# Patient Record
Sex: Female | Born: 1975 | Race: White | Hispanic: No | Marital: Single | Smoking: Never smoker
Health system: Southern US, Community
[De-identification: ages and names within clinical notes are randomized; demographics above are authoritative.]

## PROBLEM LIST (undated history)

## (undated) DIAGNOSIS — Q675 Congenital deformity of spine: Secondary | ICD-10-CM

## (undated) DIAGNOSIS — F329 Major depressive disorder, single episode, unspecified: Secondary | ICD-10-CM

## (undated) DIAGNOSIS — E049 Nontoxic goiter, unspecified: Secondary | ICD-10-CM

## (undated) DIAGNOSIS — M419 Scoliosis, unspecified: Secondary | ICD-10-CM

## (undated) DIAGNOSIS — F429 Obsessive-compulsive disorder, unspecified: Secondary | ICD-10-CM

## (undated) DIAGNOSIS — F32A Depression, unspecified: Secondary | ICD-10-CM

## (undated) DIAGNOSIS — E039 Hypothyroidism, unspecified: Secondary | ICD-10-CM

## (undated) HISTORY — DX: Obsessive-compulsive disorder, unspecified: F42.9

## (undated) HISTORY — DX: Scoliosis, unspecified: M41.9

## (undated) HISTORY — PX: WISDOM TOOTH EXTRACTION: SHX21

## (undated) HISTORY — DX: Hypothyroidism, unspecified: E03.9

## (undated) HISTORY — DX: Nontoxic goiter, unspecified: E04.9

## (undated) HISTORY — DX: Congenital deformity of spine: Q67.5

## (undated) HISTORY — PX: BACK SURGERY: SHX140

## (undated) HISTORY — DX: Depression, unspecified: F32.A

## (undated) HISTORY — DX: Major depressive disorder, single episode, unspecified: F32.9

## (undated) HISTORY — PX: LEG SURGERY: SHX1003

## (undated) HISTORY — PX: OTHER SURGICAL HISTORY: SHX169

---

## 2003-07-29 ENCOUNTER — Other Ambulatory Visit: Admission: RE | Admit: 2003-07-29 | Discharge: 2003-07-29 | Payer: Self-pay | Admitting: Obstetrics and Gynecology

## 2004-06-20 ENCOUNTER — Ambulatory Visit: Payer: Self-pay | Admitting: Sports Medicine

## 2004-08-24 ENCOUNTER — Other Ambulatory Visit: Admission: RE | Admit: 2004-08-24 | Discharge: 2004-08-24 | Payer: Self-pay | Admitting: Obstetrics and Gynecology

## 2010-08-02 ENCOUNTER — Ambulatory Visit (HOSPITAL_COMMUNITY): Admission: RE | Admit: 2010-08-02 | Discharge: 2010-08-02 | Payer: Self-pay | Admitting: Obstetrics & Gynecology

## 2014-12-08 ENCOUNTER — Encounter: Payer: Self-pay | Admitting: *Deleted

## 2014-12-16 ENCOUNTER — Other Ambulatory Visit (HOSPITAL_COMMUNITY)
Admission: RE | Admit: 2014-12-16 | Discharge: 2014-12-16 | Disposition: A | Discharge status: Home / Self Care | Payer: BLUE CROSS/BLUE SHIELD | Source: Ambulatory Visit | Attending: Adult Health | Admitting: Adult Health

## 2014-12-16 ENCOUNTER — Ambulatory Visit (INDEPENDENT_AMBULATORY_CARE_PROVIDER_SITE_OTHER): Payer: BLUE CROSS/BLUE SHIELD | Admitting: Adult Health

## 2014-12-16 ENCOUNTER — Encounter: Payer: Self-pay | Admitting: Adult Health

## 2014-12-16 VITALS — BP 108/58 | HR 64 | Ht 64.75 in | Wt 138.5 lb

## 2014-12-16 DIAGNOSIS — Z113 Encounter for screening for infections with a predominantly sexual mode of transmission: Secondary | ICD-10-CM

## 2014-12-16 DIAGNOSIS — E049 Nontoxic goiter, unspecified: Secondary | ICD-10-CM

## 2014-12-16 DIAGNOSIS — Z01419 Encounter for gynecological examination (general) (routine) without abnormal findings: Secondary | ICD-10-CM | POA: Diagnosis not present

## 2014-12-16 DIAGNOSIS — Z1151 Encounter for screening for human papillomavirus (HPV): Secondary | ICD-10-CM | POA: Insufficient documentation

## 2014-12-16 HISTORY — DX: Nontoxic goiter, unspecified: E04.9

## 2014-12-16 NOTE — Progress Notes (Signed)
Patient ID: Jessica Klein, female   DOB: [birth year], 39 y.o.   MRN: [id number] History of Present Illness: Jessica Klein is a 39 year old white female,single in for well woman gyn exam and pap.Her PCP is Dr Ouida SillsFagan.   Current Medications, Allergies, Past Medical History, Past Surgical History, Family History and Social History were reviewed in [facility] electronic medical record.     Review of Systems: Patient denies any headaches, hearing loss, fatigue, blurred vision, shortness of breath, chest pain, abdominal pain, problems with bowel movements, urination, or intercourse(not sexually active). No joint pain or mood swings(on zoloft).    Physical Exam:BP 108/58 mmHg  Pulse 64  Ht 5' 4.75" (1.645 m)  Wt 138 lb 8 oz (62.823 kg)  BMI 23.22 kg/m2  LMP 12/10/2014 General:  Well developed, well nourished, no acute distress Skin:  Warm and dry Neck:  Midline trachea,  Thyroid enlarged, R>L, good ROM, no lymphadenopathy Lungs; Clear to auscultation bilaterally Breast:  No dominant palpable mass, retraction, or nipple discharge Cardiovascular: Regular rate and rhythm Abdomen:  Soft, non tender, no hepatosplenomegaly Pelvic:  External genitalia is normal in appearance, no lesions.  The vagina is normal in appearance, with good color, moisture or rugae. Urethra has no lesions or masses. The cervix is smooth, pap with HPV performed.  Uterus is felt to be normal size, shape, and contour.  No adnexal masses or tenderness noted.Bladder is non tender, no masses felt. Extremities/musculoskeletal:  No swelling or varicosities noted, no clubbing or cyanosis Psych:  No mood changes, alert and cooperative,seems happy   Impression: Well woman gyn exam with pap Enlarged thyroid STD screening    Plan: Check CBC,CMP,TSH and HIV,RPR,HSV2 [facility], pap in 3 years if normal with negative HPV

## 2014-12-16 NOTE — Patient Instructions (Signed)
Physical in 1 year Mammogram yearly Thyroid US 4/4 at 8 am at [facility] be there at 7:45 am Will talk next week

## 2014-12-17 ENCOUNTER — Telehealth: Payer: Self-pay | Admitting: Adult Health

## 2014-12-17 LAB — COMPREHENSIVE METABOLIC PANEL WITH GFR
ALT: 13 IU/L (ref 0–32)
AST: 20 IU/L (ref 0–40)
Albumin/Globulin Ratio: 2 (ref 1.1–2.5)
Albumin: 4.4 g/dL (ref 3.5–5.5)
Alkaline Phosphatase: 63 IU/L (ref 39–117)
BUN/Creatinine Ratio: 12 (ref 8–20)
BUN: 10 mg/dL (ref 6–20)
Bilirubin Total: 0.3 mg/dL (ref 0.0–1.2)
CO2: 26 mmol/L (ref 18–29)
Calcium: 9.3 mg/dL (ref 8.7–10.2)
Chloride: 99 mmol/L (ref 97–108)
Creatinine, Ser: 0.82 mg/dL (ref 0.57–1.00)
GFR calc Af Amer: 106 mL/min/1.73
GFR calc non Af Amer: 92 mL/min/1.73
Globulin, Total: 2.2 g/dL (ref 1.5–4.5)
Glucose: 82 mg/dL (ref 65–99)
Potassium: 3.7 mmol/L (ref 3.5–5.2)
Sodium: 140 mmol/L (ref 134–144)
Total Protein: 6.6 g/dL (ref 6.0–8.5)

## 2014-12-17 LAB — CBC
HCT: 40.3 % (ref 34.0–46.6)
HEMOGLOBIN: 13.5 g/dL (ref 11.1–15.9)
MCH: 29.5 pg (ref 26.6–33.0)
MCHC: 33.5 g/dL (ref 31.5–35.7)
MCV: 88 fL (ref 79–97)
Platelets: 406 10*3/uL — ABNORMAL HIGH (ref 150–379)
RBC: 4.57 x10E6/uL (ref 3.77–5.28)
RDW: 12.7 % (ref 12.3–15.4)
WBC: 7.6 10*3/uL (ref 3.4–10.8)

## 2014-12-17 LAB — HSV 2 ANTIBODY, IGG: HSV 2 Glycoprotein G Ab, IgG: <0.91 {index} (ref 0.00–0.90)

## 2014-12-17 LAB — TSH: TSH: 32.45 u[IU]/mL — ABNORMAL HIGH (ref 0.450–4.500)

## 2014-12-17 LAB — RPR: RPR Ser Ql: NONREACTIVE

## 2014-12-17 LAB — HIV ANTIBODY (ROUTINE TESTING W REFLEX): HIV Screen 4th Generation wRfx: NONREACTIVE

## 2014-12-17 MED ORDER — LEVOTHYROXINE SODIUM 50 MCG PO TABS: 50.0000 ug | ORAL_TABLET | Freq: Every day | ORAL | Status: DC

## 2014-12-17 NOTE — Telephone Encounter (Signed)
Left message about labs and that TSH elevated and will Rx synthroid 50 mcg #30 1 daily with 3 refills will recheck labs in 8-12 weeks

## 2014-12-20 ENCOUNTER — Ambulatory Visit (HOSPITAL_COMMUNITY)
Admission: RE | Admit: 2014-12-20 | Discharge: 2014-12-20 | Disposition: A | Discharge status: Home / Self Care | Payer: BLUE CROSS/BLUE SHIELD | Source: Ambulatory Visit | Attending: Adult Health | Admitting: Adult Health

## 2014-12-20 DIAGNOSIS — E049 Nontoxic goiter, unspecified: Secondary | ICD-10-CM | POA: Diagnosis present

## 2014-12-20 LAB — CYTOLOGY - PAP

## 2014-12-23 ENCOUNTER — Telehealth: Payer: Self-pay | Admitting: Adult Health

## 2014-12-23 NOTE — Telephone Encounter (Signed)
Left message about thyroid US and to call me if has any questions

## 2014-12-23 NOTE — Telephone Encounter (Signed)
Pt taking synthroid and aware of US will  Recheck TSH in about 8 weeks and watch thyroid for now.

## 2015-08-19 ENCOUNTER — Other Ambulatory Visit: Payer: Self-pay | Admitting: Adult Health

## 2015-12-21 ENCOUNTER — Other Ambulatory Visit: Payer: Self-pay | Admitting: Adult Health

## 2016-03-22 ENCOUNTER — Other Ambulatory Visit: Payer: Self-pay | Admitting: Adult Health

## 2016-06-26 ENCOUNTER — Other Ambulatory Visit: Payer: Self-pay | Admitting: Adult Health

## 2017-03-28 DIAGNOSIS — F331 Major depressive disorder, recurrent, moderate: Secondary | ICD-10-CM | POA: Diagnosis not present

## 2017-03-28 DIAGNOSIS — E785 Hyperlipidemia, unspecified: Secondary | ICD-10-CM | POA: Diagnosis not present

## 2017-03-28 DIAGNOSIS — M545 Low back pain: Secondary | ICD-10-CM | POA: Diagnosis not present

## 2017-03-28 DIAGNOSIS — R5383 Other fatigue: Secondary | ICD-10-CM | POA: Diagnosis not present

## 2017-03-28 DIAGNOSIS — Z0389 Encounter for observation for other suspected diseases and conditions ruled out: Secondary | ICD-10-CM | POA: Diagnosis not present

## 2017-03-28 DIAGNOSIS — E039 Hypothyroidism, unspecified: Secondary | ICD-10-CM | POA: Diagnosis not present

## 2017-03-28 DIAGNOSIS — Z0000 Encounter for general adult medical examination without abnormal findings: Secondary | ICD-10-CM | POA: Diagnosis not present

## 2017-03-29 ENCOUNTER — Other Ambulatory Visit (HOSPITAL_COMMUNITY): Payer: Self-pay | Admitting: Internal Medicine

## 2017-03-29 ENCOUNTER — Ambulatory Visit (HOSPITAL_COMMUNITY)
Admission: RE | Admit: 2017-03-29 | Discharge: 2017-03-29 | Disposition: A | Discharge status: Home / Self Care | Payer: 59 | Source: Ambulatory Visit | Attending: Internal Medicine | Admitting: Internal Medicine

## 2017-03-29 ENCOUNTER — Encounter (HOSPITAL_COMMUNITY): Payer: Self-pay

## 2017-03-29 DIAGNOSIS — F329 Major depressive disorder, single episode, unspecified: Secondary | ICD-10-CM | POA: Diagnosis not present

## 2017-03-29 DIAGNOSIS — R52 Pain, unspecified: Secondary | ICD-10-CM

## 2017-04-04 ENCOUNTER — Ambulatory Visit (HOSPITAL_COMMUNITY)
Admission: RE | Admit: 2017-04-04 | Discharge: 2017-04-04 | Disposition: A | Discharge status: Home / Self Care | Payer: 59 | Source: Ambulatory Visit | Attending: Internal Medicine | Admitting: Internal Medicine

## 2017-04-04 DIAGNOSIS — Z981 Arthrodesis status: Secondary | ICD-10-CM | POA: Diagnosis not present

## 2017-04-04 DIAGNOSIS — M4186 Other forms of scoliosis, lumbar region: Secondary | ICD-10-CM | POA: Diagnosis not present

## 2017-04-04 DIAGNOSIS — M545 Low back pain: Secondary | ICD-10-CM | POA: Insufficient documentation

## 2017-04-12 DIAGNOSIS — F329 Major depressive disorder, single episode, unspecified: Secondary | ICD-10-CM | POA: Diagnosis not present

## 2017-04-26 DIAGNOSIS — F329 Major depressive disorder, single episode, unspecified: Secondary | ICD-10-CM | POA: Diagnosis not present

## 2017-06-07 DIAGNOSIS — F329 Major depressive disorder, single episode, unspecified: Secondary | ICD-10-CM | POA: Diagnosis not present

## 2017-07-23 DIAGNOSIS — E039 Hypothyroidism, unspecified: Secondary | ICD-10-CM | POA: Diagnosis not present

## 2017-08-23 DIAGNOSIS — F329 Major depressive disorder, single episode, unspecified: Secondary | ICD-10-CM | POA: Diagnosis not present

## 2017-11-29 ENCOUNTER — Other Ambulatory Visit (HOSPITAL_COMMUNITY)
Admission: RE | Admit: 2017-11-29 | Discharge: 2017-11-29 | Disposition: A | Discharge status: Home / Self Care | Payer: No Typology Code available for payment source | Source: Ambulatory Visit | Attending: Adult Health | Admitting: Adult Health

## 2017-11-29 ENCOUNTER — Ambulatory Visit: Payer: No Typology Code available for payment source | Admitting: Adult Health

## 2017-11-29 ENCOUNTER — Other Ambulatory Visit: Payer: Self-pay

## 2017-11-29 ENCOUNTER — Encounter: Payer: Self-pay | Admitting: Adult Health

## 2017-11-29 ENCOUNTER — Encounter (INDEPENDENT_AMBULATORY_CARE_PROVIDER_SITE_OTHER): Payer: Self-pay

## 2017-11-29 VITALS — BP 120/68 | HR 71 | Ht 63.75 in | Wt 118.0 lb

## 2017-11-29 DIAGNOSIS — Z124 Encounter for screening for malignant neoplasm of cervix: Secondary | ICD-10-CM | POA: Insufficient documentation

## 2017-11-29 DIAGNOSIS — Z01419 Encounter for gynecological examination (general) (routine) without abnormal findings: Secondary | ICD-10-CM

## 2017-11-29 DIAGNOSIS — Z1211 Encounter for screening for malignant neoplasm of colon: Secondary | ICD-10-CM

## 2017-11-29 DIAGNOSIS — Z0000 Encounter for general adult medical examination without abnormal findings: Secondary | ICD-10-CM | POA: Insufficient documentation

## 2017-11-29 DIAGNOSIS — Z1212 Encounter for screening for malignant neoplasm of rectum: Secondary | ICD-10-CM

## 2017-11-29 LAB — HEMOCCULT GUIAC POC 1CARD (OFFICE): FECAL OCCULT BLD: NEGATIVE

## 2017-11-29 NOTE — Progress Notes (Signed)
Subjective:     Patient ID: Jessica Klein, female   DOB: 1976/08/29, 42 y.o.   MRN: [id number]  HPI [name] is a 42 year old white female, single, G0P0, in for pelvic and pap, she had her physical with PCP. PCP is Dr Karilyn CotaGosrani, who she works for. And she sees [facility]   Review of Systems Patient denies any headaches, hearing loss, fatigue, blurred vision, shortness of breath, chest pain, abdominal pain, problems with bowel movements, urination, or intercourse(not having sex). No joint pain or mood swings.   Reviewed past medical,surgical, social and family history. Reviewed medications and allergies.  Objective:   Physical Exam BP 120/68 (BP Location: Right Arm, Patient Position: Sitting, Cuff Size: Normal)   Pulse 71   Ht 5' 3.75" (1.619 m)   Wt 118 lb (53.5 kg)   LMP 11/24/2017   BMI 20.41 kg/m   Skin warm and dry,  Breasts:no dominate palpable mass, retraction or nipple discharge. Pelvic: external genitalia is normal in appearance no lesions, vagina: pink, good moisture, brown blood,urethra has no lesions or masses noted, cervix:smooth,pap with HPV and GC/CHL performed, uterus: normal size, shape and contour, non tender, no masses felt, adnexa: no masses or tenderness noted. Bladder is non tender and no masses felt.    On rectal exam has good tone, no masses and hemoccult negative.  Assessment:     1. Encounter for gynecological examination with Papanicolaou smear of cervix   2. Routine cervical smear   3. Screening for colorectal cancer       Plan:     Pelvic in 1 year, pap in 3 if normal Get mammogram at 42 Labs with PCP

## 2017-12-03 LAB — CYTOLOGY - PAP
Chlamydia: NEGATIVE
Diagnosis: NEGATIVE
HPV: NOT DETECTED
Neisseria Gonorrhea: NEGATIVE

## 2017-12-19 ENCOUNTER — Other Ambulatory Visit (HOSPITAL_COMMUNITY): Payer: Self-pay | Admitting: Internal Medicine

## 2017-12-19 DIAGNOSIS — Z1231 Encounter for screening mammogram for malignant neoplasm of breast: Secondary | ICD-10-CM

## 2017-12-25 ENCOUNTER — Other Ambulatory Visit (HOSPITAL_COMMUNITY): Payer: Self-pay | Admitting: Internal Medicine

## 2017-12-25 DIAGNOSIS — M545 Low back pain: Secondary | ICD-10-CM

## 2018-01-03 ENCOUNTER — Ambulatory Visit (HOSPITAL_COMMUNITY)
Admission: RE | Admit: 2018-01-03 | Discharge: 2018-01-03 | Disposition: A | Discharge status: Home / Self Care | Payer: No Typology Code available for payment source | Source: Ambulatory Visit | Attending: Internal Medicine | Admitting: Internal Medicine

## 2018-01-03 ENCOUNTER — Ambulatory Visit (HOSPITAL_COMMUNITY): Payer: No Typology Code available for payment source

## 2018-01-03 DIAGNOSIS — Z1231 Encounter for screening mammogram for malignant neoplasm of breast: Secondary | ICD-10-CM | POA: Insufficient documentation

## 2018-01-17 ENCOUNTER — Ambulatory Visit (HOSPITAL_COMMUNITY)
Admission: RE | Admit: 2018-01-17 | Discharge: 2018-01-17 | Disposition: A | Discharge status: Home / Self Care | Payer: No Typology Code available for payment source | Source: Ambulatory Visit | Attending: Internal Medicine | Admitting: Internal Medicine

## 2018-01-17 DIAGNOSIS — M48061 Spinal stenosis, lumbar region without neurogenic claudication: Secondary | ICD-10-CM | POA: Diagnosis not present

## 2018-01-17 DIAGNOSIS — M545 Low back pain: Secondary | ICD-10-CM | POA: Diagnosis present

## 2018-01-17 DIAGNOSIS — Z981 Arthrodesis status: Secondary | ICD-10-CM | POA: Insufficient documentation

## 2018-01-17 MED ORDER — IOPAMIDOL (ISOVUE-300) INJECTION 61%
100.0000 mL | Freq: Once | INTRAVENOUS | Status: AC | PRN
  Administered 2018-01-17: 100 mL via INTRAVENOUS

## 2018-06-20 ENCOUNTER — Other Ambulatory Visit: Payer: Self-pay

## 2018-06-20 MED ORDER — SERTRALINE HCL 25 MG PO TABS: 75.0000 mg | ORAL_TABLET | Freq: Every day | ORAL | 0 refills | Status: DC

## 2018-06-20 NOTE — Telephone Encounter (Signed)
Pt called in was advised that her Zoloft was denied due to not having an appointment. Patient made appointment for 07/18/18, I submitted a 30 day supply to preferred pharmacy with no refills. Patient verbalized understanding.

## 2018-07-09 ENCOUNTER — Encounter: Payer: Self-pay | Admitting: Emergency Medicine

## 2018-07-09 DIAGNOSIS — F329 Major depressive disorder, single episode, unspecified: Secondary | ICD-10-CM | POA: Insufficient documentation

## 2018-07-18 ENCOUNTER — Ambulatory Visit: Payer: Self-pay | Admitting: Psychiatry

## 2018-07-25 ENCOUNTER — Ambulatory Visit: Payer: No Typology Code available for payment source | Admitting: Psychiatry

## 2018-07-25 DIAGNOSIS — F329 Major depressive disorder, single episode, unspecified: Secondary | ICD-10-CM | POA: Diagnosis not present

## 2018-07-25 MED ORDER — SERTRALINE HCL 25 MG PO TABS: 25.0000 mg | ORAL_TABLET | Freq: Every day | ORAL | 4 refills | Status: DC

## 2018-07-25 NOTE — Progress Notes (Signed)
[facility] Check  Patient ID: Jessica Klein,  MRN: [id number]  PCP: Carylon Perches, MD  Date of Evaluation: 07/25/2018 Time spent:20 minutes  Chief Complaint:   HISTORY/CURRENT STATUS: HPI patient last seen 11/22/2017 with a diagnosis of major depressive disorder.  She was doing well at the time.  She continues to do well.  She has begun to decrease Zoloft on her own.  Currently she takes 25 mg every other day and half of one the other days  Individual Medical History/ Review of Systems: Changes? :No   Allergies: Ceclor [cefaclor]  Current Medications:  Current Outpatient Medications:  .  sertraline (ZOLOFT) 25 MG tablet, Take 3 tablets (75 mg total) by mouth daily. Keep appointment for further refills., Disp: 90 tablet, Rfl: 0 .  levothyroxine (SYNTHROID, LEVOTHROID) 50 MCG tablet, TAKE ONE TABLET BY MOUTH ONCE DAILY BEFORE  BREAKFAST  **NEEDS  LABS  IN  ABOUT  4  WEEKS** (Patient not taking: Reported on 07/25/2018), Disp: 30 tablet, Rfl: 2 Medication Side Effects: none  Family Medical/ Social History: Changes?  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  She also caters a times, works at [employer] .  MENTAL HEALTH EXAM:  There were no vitals taken for this visit.There is no height or weight on file to calculate BMI.  General Appearance: Casual  Eye Contact:  Good  Speech:  Clear and Coherent  Volume:  Normal  Mood:  Euthymic  Affect:  Appropriate  Thought Process:  Goal Directed  Orientation:  Full (Time, Place, and Person)  Thought Content: Logical   Suicidal Thoughts:  No  Homicidal Thoughts:  No  Memory:  WNL  Judgement:  Good  Insight:  Good  Psychomotor Activity:  Normal  Concentration:  Concentration: Good  Recall:  Good  Fund of Knowledge: Good  Language: Good  Assets:  Social Support  ADL's:  Intact  Cognition: WNL  Prognosis:  Good    DIAGNOSES:    ICD-10-CM   1. Current episode of major depressive  disorder without prior episode, unspecified depression episode severity F32.9     Receiving Psychotherapy: Yes    RECOMMENDATIONS: Patient will continue tapering the Zoloft.  She is return in 6 months.   Anne Fu, PA-C

## 2019-01-23 ENCOUNTER — Ambulatory Visit: Payer: No Typology Code available for payment source | Admitting: Psychiatry

## 2019-01-27 ENCOUNTER — Ambulatory Visit (INDEPENDENT_AMBULATORY_CARE_PROVIDER_SITE_OTHER): Payer: No Typology Code available for payment source | Admitting: Nurse Practitioner

## 2019-02-23 ENCOUNTER — Other Ambulatory Visit (HOSPITAL_COMMUNITY): Payer: Self-pay | Admitting: Internal Medicine

## 2019-02-23 DIAGNOSIS — Z1231 Encounter for screening mammogram for malignant neoplasm of breast: Secondary | ICD-10-CM

## 2019-07-06 ENCOUNTER — Other Ambulatory Visit (INDEPENDENT_AMBULATORY_CARE_PROVIDER_SITE_OTHER): Payer: Self-pay | Admitting: Internal Medicine

## 2019-07-06 DIAGNOSIS — E039 Hypothyroidism, unspecified: Secondary | ICD-10-CM

## 2019-07-06 DIAGNOSIS — N912 Amenorrhea, unspecified: Secondary | ICD-10-CM

## 2019-07-06 DIAGNOSIS — F329 Major depressive disorder, single episode, unspecified: Secondary | ICD-10-CM

## 2019-07-06 DIAGNOSIS — R5383 Other fatigue: Secondary | ICD-10-CM

## 2019-07-06 DIAGNOSIS — E559 Vitamin D deficiency, unspecified: Secondary | ICD-10-CM

## 2019-07-06 DIAGNOSIS — R5381 Other malaise: Secondary | ICD-10-CM

## 2019-07-06 LAB — COMPLETE METABOLIC PANEL WITH GFR
AG Ratio: 1.9 (calc) (ref 1.0–2.5)
ALT: 11 U/L (ref 6–29)
AST: 13 U/L (ref 10–30)
Albumin: 4.1 g/dL (ref 3.6–5.1)
Alkaline phosphatase (APISO): 58 U/L (ref 31–125)
BUN: 14 mg/dL (ref 7–25)
CO2: 29 mmol/L (ref 20–32)
Calcium: 9.1 mg/dL (ref 8.6–10.2)
Chloride: 103 mmol/L (ref 98–110)
Creat: 0.91 mg/dL (ref 0.50–1.10)
GFR, Est African American: 91 mL/min/{1.73_m2} (ref >=60)
GFR, Est Non African American: 78 mL/min/{1.73_m2} (ref >=60)
Globulin: 2.2 g/dL (calc) (ref 1.9–3.7)
Glucose, Bld: 89 mg/dL (ref 65–99)
Potassium: 4 mmol/L (ref 3.5–5.3)
Sodium: 141 mmol/L (ref 135–146)
Total Bilirubin: 0.4 mg/dL (ref 0.2–1.2)
Total Protein: 6.3 g/dL (ref 6.1–8.1)

## 2019-07-06 LAB — CBC
HCT: 39.5 % (ref 35.0–45.0)
Hemoglobin: 13.2 g/dL (ref 11.7–15.5)
MCH: 30.3 pg (ref 27.0–33.0)
MCHC: 33.4 g/dL (ref 32.0–36.0)
MCV: 90.8 fL (ref 80.0–100.0)
MPV: 10.4 fL (ref 7.5–12.5)
Platelets: 346 10*3/uL (ref 140–400)
RBC: 4.35 10*6/uL (ref 3.80–5.10)
RDW: 13 % (ref 11.0–15.0)
WBC: 9.6 10*3/uL (ref 3.8–10.8)

## 2019-07-06 LAB — T4: T4, Total: 7 ug/dL (ref 5.1–11.9)

## 2019-07-06 LAB — TSH: TSH: 3.57 mIU/L

## 2019-07-06 LAB — VITAMIN D 25 HYDROXY (VIT D DEFICIENCY, FRACTURES): Vit D, 25-Hydroxy: 33 ng/mL (ref 30–100)

## 2019-07-06 LAB — T3, FREE: T3, Free: 2.7 pg/mL (ref 2.3–4.2)

## 2019-07-06 LAB — LUTEINIZING HORMONE: LH: 3.1 m[IU]/mL

## 2019-07-06 LAB — FOLLICLE STIMULATING HORMONE: FSH: 7.6 m[IU]/mL

## 2019-07-13 ENCOUNTER — Encounter (INDEPENDENT_AMBULATORY_CARE_PROVIDER_SITE_OTHER): Payer: Self-pay | Admitting: Nurse Practitioner

## 2019-07-13 ENCOUNTER — Other Ambulatory Visit: Payer: Self-pay

## 2019-07-13 ENCOUNTER — Ambulatory Visit (INDEPENDENT_AMBULATORY_CARE_PROVIDER_SITE_OTHER): Payer: No Typology Code available for payment source | Admitting: Nurse Practitioner

## 2019-07-13 VITALS — BP 112/70 | HR 86 | Temp 98.3°F | Ht 65.0 in | Wt 120.0 lb

## 2019-07-13 DIAGNOSIS — E039 Hypothyroidism, unspecified: Secondary | ICD-10-CM

## 2019-07-13 DIAGNOSIS — Z0000 Encounter for general adult medical examination without abnormal findings: Secondary | ICD-10-CM | POA: Diagnosis not present

## 2019-07-13 DIAGNOSIS — Z23 Encounter for immunization: Secondary | ICD-10-CM

## 2019-07-13 DIAGNOSIS — F32A Depression, unspecified: Secondary | ICD-10-CM | POA: Insufficient documentation

## 2019-07-13 DIAGNOSIS — F331 Major depressive disorder, recurrent, moderate: Secondary | ICD-10-CM | POA: Diagnosis not present

## 2019-07-13 DIAGNOSIS — F329 Major depressive disorder, single episode, unspecified: Secondary | ICD-10-CM | POA: Insufficient documentation

## 2019-07-13 DIAGNOSIS — F429 Obsessive-compulsive disorder, unspecified: Secondary | ICD-10-CM

## 2019-07-13 NOTE — Assessment & Plan Note (Signed)
This seems to be much improved since starting Prozac.  She is encouraged to continue this medication as prescribed to continue to follow-up with psychiatry.  She tells me she will.  She will follow-up in 1 month. 

## 2019-07-13 NOTE — Assessment & Plan Note (Signed)
This seems to be much improved since starting Prozac.  She is encouraged to continue this medication as prescribed to continue to follow-up with psychiatry.  She tells me she will.  She will follow-up in 1 month.

## 2019-07-13 NOTE — Assessment & Plan Note (Signed)
Appears to be currently very well controlled even without medication.  Main concern is hair loss, we did discuss starting levothyroxine to see if this would help with her hair loss.  She tells me she would like to wait to see if her hair loss improves now that her anxiety depression is more controlled and she is able to eat more.  There is a concern she may have had a potential nutritional deficiency as she was losing quite a bit of weight and had a menorrhea secondary to weight loss which also may have contributed to her hair loss.  We will discuss this further at next follow-up in approximately 1 month.

## 2019-07-13 NOTE — Progress Notes (Addendum)
Subjective:  Patient ID: Jessica Klein, female    DOB: 03/31/1976  Age: 43 y.o. MRN: [id number]  CC:  Chief Complaint  Patient presents with   Annual Exam   Hypothyroidism   Depression      HPI  This patient arrives today for her annual physical exam.  We discussed her immunizations and she is up-to-date with her tetanus shot.  She does need a flu vaccine today would like this.  Screenings: 1.  Cervical cancer screening: Last Pap smear was completed in March 2019.  She will need to follow-up for this again in March 2021. 2.  Folic acid: She is of childbearing age but is not currently sexually active.  She is not taking folic acid supplement 3.  Sexual transmitted infection screening: She has been screened for HIV in the past and this was negative. 4.  Tobacco cessation: She is a never smoker 5.  Depression: She does have a history of depression and is currently being seen by psychiatry.  She is now taking Prozac. 6. Hepatitis C: she has not been screened for this, but would prefer not to be screened at this time.  7. Breast Cancer: She had her last mammogram completed in 12/2017. This was negative. She would like to hold off on additional mammogram until next year due to her concerns related to COVID19  She also has a history of hypothyroidism.  Last blood work was completed earlier this month and showed that her thyroid panel was within normal limits.  She is not currently on any medication.  She does continue to have hair loss which is concerning for her.  She also has a history of anxiety and depression.  She has had suicidal ideation recently in the past, but denies any today.  She is seeing psychiatry for this and has been started on Prozac.  She tells me she started this approximately 2 months ago and has found that her mood has improved drastically since then.  She is also recently been diagnosed with OCD by her psychiatrist and tells me that her compulsions and  behaviors have also improved since starting the Prozac.  Past Medical History:  Diagnosis Date   Congenital deformity of spine    Depression    Enlarged thyroid 12/16/2014   Hypothyroidism    OCD (obsessive compulsive disorder)    Scoliosis       Family History  Problem Relation Age of Onset   Thyroid disease Mother    Hypertension Mother    Other Mother        sleep apnea; degenerative disc   Breast cancer Mother 79   Glaucoma Mother    Cancer Father        melonoma   Hypertension Father    Thyroid disease Brother        hyperthyroidism   Heart attack Maternal Grandmother    Hypertension Maternal Grandmother    Other Maternal Grandmother        osteoporosis   Other Maternal Grandfather        aneursym   Stroke Maternal Grandfather    Cancer Paternal Grandmother        breast   Thyroid disease Paternal Grandmother    Other Paternal Grandmother        scolosis   Stroke Paternal Grandfather        x 2   Skin cancer Paternal Grandfather     Social History   Social History Narrative  Not on file     Current Meds  Medication Sig   cholecalciferol (VITAMIN D3) 25 MCG (1000 UT) tablet Take 5,000 Units by mouth daily.   FLUoxetine (PROZAC) 20 MG tablet Take 20 mg by mouth daily.   Multiple Vitamins-Minerals (MULTIVITAMIN WITH MINERALS) tablet Take 1 tablet by mouth daily.    ROS:  Review of Systems  Constitutional: Positive for malaise/fatigue. Negative for fever.  HENT: Positive for congestion (Has been intermittent and mild).   Eyes: Negative.   Respiratory: Negative for cough, shortness of breath and wheezing.   Cardiovascular: Negative for chest pain and palpitations.  Gastrointestinal: Positive for diarrhea (has had two episodes in the last few months, has not persisted). Negative for abdominal pain, blood in stool, constipation, nausea and vomiting.  Genitourinary: Negative for dysuria, hematuria and urgency.    Musculoskeletal: Positive for neck pain.  Skin: Positive for rash (pruritic rash that is improving. Patient thinks it is due to change in type soap). Negative for itching.  Neurological: Negative.  Negative for dizziness, sensory change, weakness and headaches.  Endo/Heme/Allergies: Negative.   Psychiatric/Behavioral: Positive for depression. Negative for hallucinations, substance abuse and suicidal ideas (Not currently; Has had suicidal thoughts, no plan. This is getting better now that she is being followed by psychiatry and is on medication now). The patient is nervous/anxious.      Objective:   Today's Vitals: BP 112/70    Pulse 86    Temp 98.3 F (36.8 C)    Ht 5\' 5"  (1.651 m)    Wt 120 lb (54.4 kg)    LMP 07/10/2019    SpO2 98%    BMI 19.97 kg/m  Vitals with BMI 07/13/2019 11/29/2017 12/16/2014  Height 5\' 5"  5' 3.75" 5' 4.75"  Weight 120 lbs 118 lbs 138 lbs 8 oz  BMI 19.97 00.86 76.1  Systolic 950 932 671  Diastolic 70 68 58  Pulse 86 71 64     Physical Exam Constitutional:      General: She is not in acute distress.    Appearance: Normal appearance. She is normal weight.  HENT:     Head: Normocephalic and atraumatic.     Right Ear: Tympanic membrane, ear canal and external ear normal.     Left Ear: Tympanic membrane, ear canal and external ear normal.     Nose: Nose normal.     Mouth/Throat:     Comments: Patient refused having mouth and throat evaluated Eyes:     General: No scleral icterus.    Extraocular Movements: Extraocular movements intact.     Pupils: Pupils are equal, round, and reactive to light.  Neck:     Musculoskeletal: Normal range of motion and neck supple.  Cardiovascular:     Rate and Rhythm: Normal rate and regular rhythm.     Pulses: Normal pulses.     Heart sounds: Normal heart sounds.  Pulmonary:     Effort: Pulmonary effort is normal.     Breath sounds: Normal breath sounds.  Chest:     Breasts: Breasts are symmetrical.        Right:  Normal. No swelling, inverted nipple, mass, skin change or tenderness.        Left: Normal. No swelling, inverted nipple, mass, skin change or tenderness.  Abdominal:     General: Abdomen is flat. Bowel sounds are normal.     Palpations: Abdomen is soft. There is no mass.     Tenderness: There is no abdominal tenderness.  Musculoskeletal: Normal range of motion.  Lymphadenopathy:     Upper Body:     Right upper body: No supraclavicular, axillary or pectoral adenopathy.     Left upper body: No supraclavicular, axillary or pectoral adenopathy.  Skin:    General: Skin is warm and dry.  Neurological:     General: No focal deficit present.     Mental Status: She is alert and oriented to person, place, and time.  Psychiatric:        Mood and Affect: Mood normal.        Behavior: Behavior normal.        Judgment: Judgment normal.          Assessment   1. Needs flu shot   2. Obsessive-compulsive disorder, unspecified type   3. Moderate episode of recurrent major depressive disorder (HCC)   4. Hypothyroidism, unspecified type   5. Preventative health care       Tests ordered Orders Placed This Encounter  Procedures   Flu Vaccine QUAD 6+ mos PF IM (Fluarix Quad PF)     Plan: Please see assessment and plan per problem list below.  In addition to performing her annual physical exam I also performed an office visit to address some of her chronic conditions as discussed above and below.   No orders of the defined types were placed in this encounter.   Patient to follow-up in 1 month.  Elenore PaddySARAH E Bartolo Montanye, NP

## 2019-07-13 NOTE — Assessment & Plan Note (Signed)
She was given the flu shot today in the office.  We did discuss her screenings and I recommended that if she does become sexually active or wants to try to start a family, that she should start at least a folic acid supplement if not a prenatal vitamin daily.  She tells me she understands.  We will not screen for hepatitis C per her request.  We will send her for mammogram in 1 year.  She will follow-up at office visit in 1 month, and will need to repeat annual physical exam in 1 year.

## 2019-08-10 ENCOUNTER — Encounter (INDEPENDENT_AMBULATORY_CARE_PROVIDER_SITE_OTHER): Payer: Self-pay | Admitting: Nurse Practitioner

## 2019-08-10 ENCOUNTER — Other Ambulatory Visit: Payer: Self-pay

## 2019-08-10 ENCOUNTER — Telehealth (INDEPENDENT_AMBULATORY_CARE_PROVIDER_SITE_OTHER): Payer: No Typology Code available for payment source | Admitting: Nurse Practitioner

## 2019-08-10 DIAGNOSIS — E039 Hypothyroidism, unspecified: Secondary | ICD-10-CM

## 2019-08-10 DIAGNOSIS — F331 Major depressive disorder, recurrent, moderate: Secondary | ICD-10-CM | POA: Diagnosis not present

## 2019-08-10 DIAGNOSIS — L659 Nonscarring hair loss, unspecified: Secondary | ICD-10-CM | POA: Insufficient documentation

## 2019-08-10 NOTE — Assessment & Plan Note (Signed)
This appears to be stable if not improving.  Patient does not want to start any medication to treat her thyroid to see if this stabilizes her hair loss.  She was encouraged to continue to monitor this and let me know if she wants to make any changes to her medications.  She tells me she understands.

## 2019-08-10 NOTE — Assessment & Plan Note (Signed)
Continues to be stable.  She will continue on her medication as prescribed per psychiatry, she was encouraged to follow-up with psychiatry as scheduled.

## 2019-08-10 NOTE — Assessment & Plan Note (Signed)
No changes to medication regimen at this time.  We will continue to monitor this closely.  We will need to check thyroid panel at next office visit.

## 2019-08-10 NOTE — Progress Notes (Signed)
Due to national recommendations of social distancing related to the COVID19 pandemic, an audio/visual tele-health visit was felt to be the most appropriate encounter type for this patient today. I connected with  Jessica Klein on 08/10/19 utilizing audio/visual technology and verified that I am speaking with the correct person. The patient was located at their home, and I was located at the office of [facility] during the encounter. The patient was agreeable to the visit being conducted in this manner.   Subjective:  Patient ID: Jessica Klein, female    DOB: Apr 03, 1976  Age: 43 y.o. MRN: [id number]  CC:  Chief Complaint  Patient presents with  . Depression  . Hypothyroidism  . Alopecia      HPI   Depression: This patient continues to follow with psychiatry for depression.  She continues on Prozac 20 mg daily.  She tells me she is considering increasing her dose to 30 mg at the instruction of her psychiatrist.  She tells me she continues to improve in functional status, and feels her mood continues to improve.  Hypothyroidism: She is not on any medication at this time.  Last thyroid panel was within normal limits this was collected in October 2020.  We discussed possibly starting her on thyroid medication, but she wanted to wait to see how she felt after being on Prozac for some time.  Her main bothersome symptom that may be related to her thyroid is hair loss.  Alopecia: She has had thinning hair/hair loss chronically.  She has been evaluated by dermatology this as well in the past.  Her alopecia seem to worsen when her depression worsened as well.  Today, she tells me her hair loss is slowing.  She believes this is due to her mood improving since starting Prozac as well as the fact that her appetite has increased thus she has been eating more and probably has been getting better nutrients.  Past Medical History:  Diagnosis Date  . Congenital deformity of spine   .  Depression   . Enlarged thyroid 12/16/2014  . Hypothyroidism   . OCD (obsessive compulsive disorder)   . Scoliosis       Family History  Problem Relation Age of Onset  . Thyroid disease Mother   . Hypertension Mother   . Other Mother        sleep apnea; degenerative disc  . Breast cancer Mother 42  . Glaucoma Mother   . Cancer Father        melonoma  . Hypertension Father   . Thyroid disease Brother        hyperthyroidism  . Heart attack Maternal Grandmother   . Hypertension Maternal Grandmother   . Other Maternal Grandmother        osteoporosis  . Other Maternal Grandfather        aneursym  . Stroke Maternal Grandfather   . Cancer Paternal Grandmother        breast  . Thyroid disease Paternal Grandmother   . Other Paternal Grandmother        scolosis  . Stroke Paternal Grandfather        x 2  . Skin cancer Paternal Grandfather     Social History   Social History Narrative  . Not on file   Social History   Tobacco Use  . Smoking status: Never Smoker  . Smokeless tobacco: Never Used  Substance Use Topics  . Alcohol use: Yes  Comment: occasional     No outpatient medications have been marked as taking for the 08/10/19 encounter (Video Visit) with Ailene Ards, NP.    ROS:  Review of Systems  Constitutional: Negative for chills and fever.  Respiratory: Negative for cough, shortness of breath and wheezing.   Cardiovascular: Negative for chest pain and palpitations.       Has noticed some skipped beats. This is a chronic intermittent problem. Characteristics are unchanged.   Endo/Heme/Allergies:       Hair loss seems to be slowing  Psychiatric/Behavioral: Negative for depression and suicidal ideas.     Objective:   Today's Vitals: There were no vitals taken for this visit. Vitals with BMI 07/13/2019 11/29/2017 12/16/2014  Height 5\' 5"  5' 3.75" 5' 4.75"  Weight 120 lbs 118 lbs 138 lbs 8 oz  BMI 19.97 46.65 99.3  Systolic 570 177 939  Diastolic  70 68 58  Pulse 86 71 64     Physical Exam Comprehensive physical exam not completed today as office visit was conducted remotely.  Vital signs were not completed, as patient did not collect vital signs prior to his appointment.  Her mother is a Equities trader and is able to monitor the patient's vital signs regularly.  Patient was alert and oriented, she appeared well and well groomed on video.  She answered questions appropriately.  Thought content and judgment appear to be within normal limits.      Assessment   No diagnosis found.    Tests ordered No orders of the defined types were placed in this encounter.    Plan: Please see assessment and plan per problem list below.   No orders of the defined types were placed in this encounter.   Patient to follow-up in approximately 5 months, but was encouraged to call the office with questions or concerns prior to her next appointment.  Ailene Ards, NP

## 2019-12-29 ENCOUNTER — Ambulatory Visit (INDEPENDENT_AMBULATORY_CARE_PROVIDER_SITE_OTHER): Payer: No Typology Code available for payment source | Admitting: Nurse Practitioner

## 2020-01-05 DIAGNOSIS — F429 Obsessive-compulsive disorder, unspecified: Secondary | ICD-10-CM | POA: Diagnosis not present

## 2020-01-12 ENCOUNTER — Encounter (INDEPENDENT_AMBULATORY_CARE_PROVIDER_SITE_OTHER): Payer: Self-pay

## 2020-01-19 DIAGNOSIS — F429 Obsessive-compulsive disorder, unspecified: Secondary | ICD-10-CM | POA: Diagnosis not present

## 2020-01-25 DIAGNOSIS — F429 Obsessive-compulsive disorder, unspecified: Secondary | ICD-10-CM | POA: Diagnosis not present

## 2020-01-25 DIAGNOSIS — F411 Generalized anxiety disorder: Secondary | ICD-10-CM | POA: Diagnosis not present

## 2020-01-26 ENCOUNTER — Other Ambulatory Visit: Payer: Self-pay

## 2020-01-26 ENCOUNTER — Ambulatory Visit (INDEPENDENT_AMBULATORY_CARE_PROVIDER_SITE_OTHER): Payer: BC Managed Care – PPO | Admitting: Nurse Practitioner

## 2020-01-26 ENCOUNTER — Encounter (INDEPENDENT_AMBULATORY_CARE_PROVIDER_SITE_OTHER): Payer: Self-pay | Admitting: Nurse Practitioner

## 2020-01-26 VITALS — BP 125/80 | HR 78 | Temp 98.2°F | Ht 65.0 in | Wt 133.0 lb

## 2020-01-26 DIAGNOSIS — E039 Hypothyroidism, unspecified: Secondary | ICD-10-CM

## 2020-01-26 DIAGNOSIS — F331 Major depressive disorder, recurrent, moderate: Secondary | ICD-10-CM | POA: Diagnosis not present

## 2020-01-26 DIAGNOSIS — Z1231 Encounter for screening mammogram for malignant neoplasm of breast: Secondary | ICD-10-CM

## 2020-01-26 NOTE — Progress Notes (Signed)
Subjective:  Patient ID: Jessica Klein, female    DOB: 18-Jan-1976  Age: 44 y.o. MRN: [id number]  CC:  Chief Complaint  Patient presents with  . Hypothyroidism  . Follow-up  . Anxiety      HPI  This patient comes in today for the above.  Hypothyroidism: She has a history of hypothyroidism.  Currently she is not on any medications.  Last thyroid panel was collected in October 2020 and showed TSH of 3.57, T4 of 7.0, and T3 of 2.7.  She tells me she is still feeling generally well at this time.  Anxiety: She was experiencing some significant anxiety and depression in the recent past.  This seemed to have been spurred by the current COVID-19 pandemic.  She did eventually get established with a psychiatrist and has been following with them regularly.  She continues on Prozac 20 mg daily.  She tells me is tolerating this well and feel the medication is generally controlling her mood well.  Health maintenance screenings: She is overdue for mammogram screening for breast cancer.   Past Medical History:  Diagnosis Date  . Congenital deformity of spine   . Depression   . Enlarged thyroid 12/16/2014  . Hypothyroidism   . OCD (obsessive compulsive disorder)   . Scoliosis       Family History  Problem Relation Age of Onset  . Thyroid disease Mother   . Hypertension Mother   . Other Mother        sleep apnea; degenerative disc  . Breast cancer Mother 64  . Glaucoma Mother   . Cancer Father        melonoma  . Hypertension Father   . Thyroid disease Brother        hyperthyroidism  . Heart attack Maternal Grandmother   . Hypertension Maternal Grandmother   . Other Maternal Grandmother        osteoporosis  . Other Maternal Grandfather        aneursym  . Stroke Maternal Grandfather   . Cancer Paternal Grandmother        breast  . Thyroid disease Paternal Grandmother   . Other Paternal Grandmother        scolosis  . Stroke Paternal Grandfather        x 2  . Skin  cancer Paternal Grandfather     Social History   Social History Narrative  . Not on file   Social History   Tobacco Use  . Smoking status: Never Smoker  . Smokeless tobacco: Never Used  Substance Use Topics  . Alcohol use: Yes    Comment: occasional     Current Meds  Medication Sig  . cholecalciferol (VITAMIN D3) 25 MCG (1000 UT) tablet Take 5,000 Units by mouth daily.  Marland Kitchen FLUoxetine (PROZAC) 20 MG tablet Take 20 mg by mouth daily.  . Multiple Vitamins-Minerals (MULTIVITAMIN WITH MINERALS) tablet Take 1 tablet by mouth daily.    ROS:  Reviewed and negative   Objective:   Today's Vitals: BP 125/80 (BP Location: Left Arm, Patient Position: Sitting, Cuff Size: Normal)   Pulse 78   Temp 98.2 F (36.8 C) (Temporal)   Ht 5\' 5"  (1.651 m)   Wt 133 lb (60.3 kg)   SpO2 99%   BMI 22.13 kg/m  Vitals with BMI 01/26/2020 07/13/2019 11/29/2017  Height 5\' 5"  5\' 5"  5' 3.75"  Weight 133 lbs 120 lbs 118 lbs  BMI 22.13 19.97 20.42  Systolic 125 112  191  Diastolic 80 70 68  Pulse 78 86 71     Physical Exam Vitals reviewed.  Constitutional:      General: She is not in acute distress.    Appearance: Normal appearance.  HENT:     Head: Normocephalic and atraumatic.  Cardiovascular:     Rate and Rhythm: Normal rate and regular rhythm.     Pulses: Normal pulses.     Heart sounds: Normal heart sounds.  Pulmonary:     Effort: Pulmonary effort is normal.     Breath sounds: Normal breath sounds.  Skin:    General: Skin is warm and dry.  Neurological:     General: No focal deficit present.     Mental Status: She is alert and oriented to person, place, and time.  Psychiatric:        Mood and Affect: Mood normal.        Behavior: Behavior normal.        Judgment: Judgment normal.          Assessment and Plan   1. Hypothyroidism, unspecified type   2. Encounter for screening mammogram for malignant neoplasm of breast   3. Moderate episode of recurrent major depressive  disorder ([place])      Plan: 1.  I will collect thyroid panel today for further evaluation today.  2.  I will send referral for patient to have a screening mammogram completed.  3.  She will continue to follow-up with her psychiatrist as scheduled and will continue her Prozac dose as prescribed.   Tests ordered Orders Placed This Encounter  Procedures  . MM Digital Screening  . TSH  . T3, Free  . T4, Free      No orders of the defined types were placed in this encounter.   Patient to follow-up in 6 months for annual physical exam or sooner as needed.  Ailene Ards, NP

## 2020-01-27 ENCOUNTER — Other Ambulatory Visit (INDEPENDENT_AMBULATORY_CARE_PROVIDER_SITE_OTHER): Payer: Self-pay | Admitting: Nurse Practitioner

## 2020-01-27 DIAGNOSIS — E039 Hypothyroidism, unspecified: Secondary | ICD-10-CM

## 2020-01-27 LAB — TSH: TSH: 4.75 mIU/L — ABNORMAL HIGH

## 2020-01-27 LAB — T3, FREE: T3, Free: 3 pg/mL (ref 2.3–4.2)

## 2020-01-27 LAB — T4, FREE: Free T4: 1 ng/dL (ref 0.8–1.8)

## 2020-01-27 MED ORDER — LEVOTHYROXINE SODIUM 25 MCG PO TABS: 25.0000 ug | ORAL_TABLET | Freq: Every day | ORAL | 3 refills | Status: DC

## 2020-02-02 DIAGNOSIS — F429 Obsessive-compulsive disorder, unspecified: Secondary | ICD-10-CM | POA: Diagnosis not present

## 2020-02-04 ENCOUNTER — Inpatient Hospital Stay (HOSPITAL_COMMUNITY): Admission: RE | Admit: 2020-02-04 | Payer: BC Managed Care – PPO | Source: Ambulatory Visit

## 2020-02-08 ENCOUNTER — Ambulatory Visit (HOSPITAL_COMMUNITY): Payer: BC Managed Care – PPO

## 2020-02-11 ENCOUNTER — Other Ambulatory Visit: Payer: Self-pay

## 2020-02-11 ENCOUNTER — Ambulatory Visit (HOSPITAL_COMMUNITY)
Admission: RE | Admit: 2020-02-11 | Discharge: 2020-02-11 | Disposition: A | Discharge status: Home / Self Care | Payer: BC Managed Care – PPO | Source: Ambulatory Visit | Attending: Nurse Practitioner | Admitting: Nurse Practitioner

## 2020-02-11 DIAGNOSIS — Z1231 Encounter for screening mammogram for malignant neoplasm of breast: Secondary | ICD-10-CM

## 2020-02-16 DIAGNOSIS — F429 Obsessive-compulsive disorder, unspecified: Secondary | ICD-10-CM | POA: Diagnosis not present

## 2020-03-01 DIAGNOSIS — F429 Obsessive-compulsive disorder, unspecified: Secondary | ICD-10-CM | POA: Diagnosis not present

## 2020-07-04 ENCOUNTER — Telehealth (INDEPENDENT_AMBULATORY_CARE_PROVIDER_SITE_OTHER): Payer: Self-pay | Admitting: Nurse Practitioner

## 2020-07-05 ENCOUNTER — Other Ambulatory Visit (INDEPENDENT_AMBULATORY_CARE_PROVIDER_SITE_OTHER): Payer: Self-pay | Admitting: Nurse Practitioner

## 2020-07-05 DIAGNOSIS — Z13220 Encounter for screening for lipoid disorders: Secondary | ICD-10-CM

## 2020-07-05 DIAGNOSIS — Z0000 Encounter for general adult medical examination without abnormal findings: Secondary | ICD-10-CM

## 2020-07-05 DIAGNOSIS — E039 Hypothyroidism, unspecified: Secondary | ICD-10-CM

## 2020-07-05 DIAGNOSIS — E559 Vitamin D deficiency, unspecified: Secondary | ICD-10-CM

## 2020-07-05 DIAGNOSIS — Z1159 Encounter for screening for other viral diseases: Secondary | ICD-10-CM

## 2020-07-05 DIAGNOSIS — Z131 Encounter for screening for diabetes mellitus: Secondary | ICD-10-CM

## 2020-07-05 NOTE — Progress Notes (Signed)
Blood work for upcoming annual physical

## 2020-07-05 NOTE — Telephone Encounter (Signed)
Orders placed.

## 2020-07-13 ENCOUNTER — Encounter (INDEPENDENT_AMBULATORY_CARE_PROVIDER_SITE_OTHER): Payer: Self-pay | Admitting: Nurse Practitioner

## 2020-07-15 DIAGNOSIS — Z1159 Encounter for screening for other viral diseases: Secondary | ICD-10-CM | POA: Diagnosis not present

## 2020-07-15 DIAGNOSIS — E559 Vitamin D deficiency, unspecified: Secondary | ICD-10-CM | POA: Diagnosis not present

## 2020-07-15 DIAGNOSIS — Z13220 Encounter for screening for lipoid disorders: Secondary | ICD-10-CM | POA: Diagnosis not present

## 2020-07-15 DIAGNOSIS — E039 Hypothyroidism, unspecified: Secondary | ICD-10-CM | POA: Diagnosis not present

## 2020-07-15 DIAGNOSIS — Z131 Encounter for screening for diabetes mellitus: Secondary | ICD-10-CM | POA: Diagnosis not present

## 2020-07-18 LAB — LIPID PANEL
Cholesterol: 201 mg/dL — ABNORMAL HIGH (ref <200)
HDL: 69 mg/dL (ref >=50)
LDL Cholesterol (Calc): 117 mg/dL (calc) — ABNORMAL HIGH
Non-HDL Cholesterol (Calc): 132 mg/dL (calc) — ABNORMAL HIGH (ref <130)
Total CHOL/HDL Ratio: 2.9 (calc) (ref <5.0)
Triglycerides: 62 mg/dL (ref <150)

## 2020-07-18 LAB — COMPLETE METABOLIC PANEL WITH GFR
AG Ratio: 2 (calc) (ref 1.0–2.5)
ALT: 10 U/L (ref 6–29)
AST: 16 U/L (ref 10–30)
Albumin: 4.2 g/dL (ref 3.6–5.1)
Alkaline phosphatase (APISO): 57 U/L (ref 31–125)
BUN: 13 mg/dL (ref 7–25)
CO2: 27 mmol/L (ref 20–32)
Calcium: 9.3 mg/dL (ref 8.6–10.2)
Chloride: 103 mmol/L (ref 98–110)
Creat: 0.82 mg/dL (ref 0.50–1.10)
GFR, Est African American: 102 mL/min/{1.73_m2} (ref >=60)
GFR, Est Non African American: 88 mL/min/{1.73_m2} (ref >=60)
Globulin: 2.1 g/dL (calc) (ref 1.9–3.7)
Glucose, Bld: 83 mg/dL (ref 65–99)
Potassium: 4.5 mmol/L (ref 3.5–5.3)
Sodium: 137 mmol/L (ref 135–146)
Total Bilirubin: 0.5 mg/dL (ref 0.2–1.2)
Total Protein: 6.3 g/dL (ref 6.1–8.1)

## 2020-07-18 LAB — HEPATITIS C ANTIBODY
Hepatitis C Ab: NONREACTIVE
SIGNAL TO CUT-OFF: 0.01 (ref <1.00)

## 2020-07-18 LAB — CBC WITH DIFFERENTIAL/PLATELET
Absolute Monocytes: 488 cells/uL (ref 200–950)
Basophils Absolute: 39 cells/uL (ref 0–200)
Basophils Relative: 0.6 %
Eosinophils Absolute: 163 cells/uL (ref 15–500)
Eosinophils Relative: 2.5 %
HCT: 40.6 % (ref 35.0–45.0)
Hemoglobin: 13.7 g/dL (ref 11.7–15.5)
Lymphs Abs: 2015 cells/uL (ref 850–3900)
MCH: 30.4 pg (ref 27.0–33.0)
MCHC: 33.7 g/dL (ref 32.0–36.0)
MCV: 90.2 fL (ref 80.0–100.0)
MPV: 10.9 fL (ref 7.5–12.5)
Monocytes Relative: 7.5 %
Neutro Abs: 3796 cells/uL (ref 1500–7800)
Neutrophils Relative %: 58.4 %
Platelets: 324 10*3/uL (ref 140–400)
RBC: 4.5 10*6/uL (ref 3.80–5.10)
RDW: 11.8 % (ref 11.0–15.0)
Total Lymphocyte: 31 %
WBC: 6.5 10*3/uL (ref 3.8–10.8)

## 2020-07-18 LAB — T4, FREE: Free T4: 1.1 ng/dL (ref 0.8–1.8)

## 2020-07-18 LAB — TSH: TSH: 6.16 mIU/L — ABNORMAL HIGH

## 2020-07-18 LAB — HEMOGLOBIN A1C
Hgb A1c MFr Bld: 4.9 % of total Hgb (ref <5.7)
Mean Plasma Glucose: 94 (calc)
eAG (mmol/L): 5.2 (calc)

## 2020-07-18 LAB — T3, FREE: T3, Free: 2.9 pg/mL (ref 2.3–4.2)

## 2020-07-18 LAB — VITAMIN D 25 HYDROXY (VIT D DEFICIENCY, FRACTURES): Vit D, 25-Hydroxy: 62 ng/mL (ref 30–100)

## 2020-07-20 ENCOUNTER — Encounter (INDEPENDENT_AMBULATORY_CARE_PROVIDER_SITE_OTHER): Payer: Self-pay | Admitting: Nurse Practitioner

## 2020-07-20 ENCOUNTER — Ambulatory Visit (INDEPENDENT_AMBULATORY_CARE_PROVIDER_SITE_OTHER): Payer: BC Managed Care – PPO | Admitting: Nurse Practitioner

## 2020-07-20 ENCOUNTER — Other Ambulatory Visit: Payer: Self-pay

## 2020-07-20 VITALS — BP 104/68 | HR 76 | Temp 97.9°F | Ht 63.3 in | Wt 136.2 lb

## 2020-07-20 DIAGNOSIS — E039 Hypothyroidism, unspecified: Secondary | ICD-10-CM

## 2020-07-20 DIAGNOSIS — F331 Major depressive disorder, recurrent, moderate: Secondary | ICD-10-CM | POA: Diagnosis not present

## 2020-07-20 DIAGNOSIS — Z0001 Encounter for general adult medical examination with abnormal findings: Secondary | ICD-10-CM | POA: Diagnosis not present

## 2020-07-20 DIAGNOSIS — E785 Hyperlipidemia, unspecified: Secondary | ICD-10-CM | POA: Diagnosis not present

## 2020-07-20 DIAGNOSIS — Z23 Encounter for immunization: Secondary | ICD-10-CM | POA: Diagnosis not present

## 2020-07-20 MED ORDER — LEVOTHYROXINE SODIUM 25 MCG PO TABS: 25.0000 ug | ORAL_TABLET | Freq: Every day | ORAL | 3 refills | Status: DC

## 2020-07-20 NOTE — Patient Instructions (Addendum)
Call [facility] to Reschedule your MAMMOGRAM   Influenza (Flu) Vaccine (Inactivated or Recombinant): What You Need to Know 1. Why get vaccinated? Influenza vaccine can prevent influenza (flu). Flu is a contagious disease that spreads around the [place] every year, usually between October and May. Anyone can get the flu, but it is more dangerous for some people. Infants and young children, people 42 years of age and older, pregnant women, and people with certain health conditions or a weakened immune system are at greatest risk of flu complications. Pneumonia, bronchitis, sinus infections and ear infections are examples of flu-related complications. If you have a medical condition, such as heart disease, cancer or diabetes, flu can make it worse. Flu can cause fever and chills, sore throat, muscle aches, fatigue, cough, headache, and runny or stuffy nose. Some people may have vomiting and diarrhea, though this is more common in children than adults. Each year thousands of people in the [place] die from flu, and many more are hospitalized. Flu vaccine prevents millions of illnesses and flu-related visits to the doctor each year. 2. Influenza vaccine CDC recommends everyone 58 months of age and older get vaccinated every flu season. Children 6 months through 20 years of age may need 2 doses during a single flu season. Everyone else needs only 1 dose each flu season. It takes about 2 weeks for protection to develop after vaccination. There are many flu viruses, and they are always changing. Each year a new flu vaccine is made to protect against three or four viruses that are likely to cause disease in the upcoming flu season. Even when the vaccine doesn't exactly match these viruses, it may still provide some protection. Influenza vaccine does not cause flu. Influenza vaccine may be given at the same time as other vaccines. 3. Talk with your health care provider Tell your vaccine provider if  the person getting the vaccine:  Has had an allergic reaction after a previous dose of influenza vaccine, or has any severe, life-threatening allergies.  Has ever had Guillain-Barr Syndrome (also called GBS). In some cases, your health care provider may decide to postpone influenza vaccination to a future visit. People with minor illnesses, such as a cold, may be vaccinated. People who are moderately or severely ill should usually wait until they recover before getting influenza vaccine. Your health care provider can give you more information. 4. Risks of a vaccine reaction  Soreness, redness, and swelling where shot is given, fever, muscle aches, and headache can happen after influenza vaccine.  There may be a very small increased risk of Guillain-Barr Syndrome (GBS) after inactivated influenza vaccine (the flu shot). Young children who get the flu shot along with pneumococcal vaccine (PCV13), and/or DTaP vaccine at the same time might be slightly more likely to have a seizure caused by fever. Tell your health care provider if a child who is getting flu vaccine has ever had a seizure. People sometimes faint after medical procedures, including vaccination. Tell your provider if you feel dizzy or have vision changes or ringing in the ears. As with any medicine, there is a very remote chance of a vaccine causing a severe allergic reaction, other serious injury, or death. 5. What if there is a serious problem? An allergic reaction could occur after the vaccinated person leaves the clinic. If you see signs of a severe allergic reaction (hives, swelling of the face and throat, difficulty breathing, a fast heartbeat, dizziness, or weakness), call 9-1-1 and get the  person to the nearest hospital. For other signs that concern you, call your health care provider. Adverse reactions should be reported to the Vaccine Adverse Event Reporting System (VAERS). Your health care provider will usually file this  report, or you can do it yourself. Visit the VAERS website at www.vaers.LAgents.no or call 984-649-0010.VAERS is only for reporting reactions, and VAERS staff do not give medical advice. 6. The National Vaccine Injury Compensation Program The [employer] Vaccine Injury Compensation Program (VICP) is a federal program that was created to compensate people who may have been injured by certain vaccines. Visit the VICP website at SpiritualWord.at or call 469-630-2830 to learn about the program and about filing a claim. There is a time limit to file a claim for compensation. 7. How can I learn more?  Ask your healthcare provider.  Call your local or state health department.  Contact the Centers for Disease Control and Prevention (CDC): ? Call 6468543351 (1-800-CDC-INFO) or ? Visit CDC's BiotechRoom.com.cy Vaccine Information Statement (Interim) Inactivated Influenza Vaccine (05/01/2018) This information is not intended to replace advice given to you by your health care provider. Make sure you discuss any questions you have with your health care provider. Document Revised: 12/23/2018 Document Reviewed: 05/05/2018 Elsevier Patient Education  2020 [employer].

## 2020-07-20 NOTE — Addendum Note (Signed)
Addended by: Ronita Hipps on: 07/20/2020 03:02 PM   Modules accepted: Orders

## 2020-07-20 NOTE — Progress Notes (Signed)
Subjective:  Patient ID: Jessica Klein, female    DOB: September 18, 1975  Age: 44 y.o. MRN: [id number]  CC:  Chief Complaint  Patient presents with  . Annual Exam    Doing okay  . Hypothyroidism  . Depression  . Hyperlipidemia      HPI  This patient arrives today for the above.  She tells me she is feeling well overall. She came to the office about 1 week ago to have blood work drawn in preparation for today. TSH was mildly elevated at 6.16, and lipid panel showed elevated LDL at 117 and total cholesterol of 201. She was on levothyroxine daily, but tells me she ran out of the prescription a couple of weeks ago.   As far as health maintenance is concerned she is due for the flu shot today, and will also be due for her covid 19 vaccine booster. She is also due for her mammogram and depression screening.  She continues on her antidepressant for depression as well as following up with her therapist for talk therapy.  She tells me she is tolerating these well and feels that her mood is stable.  Past Medical History:  Diagnosis Date  . Congenital deformity of spine   . Depression   . Enlarged thyroid 12/16/2014  . Hypothyroidism   . OCD (obsessive compulsive disorder)   . Scoliosis       Family History  Problem Relation Age of Onset  . Thyroid disease Mother   . Hypertension Mother   . Other Mother        sleep apnea; degenerative disc  . Breast cancer Mother 22  . Glaucoma Mother   . Cancer Father        melonoma  . Hypertension Father   . Thyroid disease Brother        hyperthyroidism  . Heart attack Maternal Grandmother   . Hypertension Maternal Grandmother   . Other Maternal Grandmother        osteoporosis  . Other Maternal Grandfather        aneursym  . Stroke Maternal Grandfather   . Cancer Paternal Grandmother        breast  . Thyroid disease Paternal Grandmother   . Other Paternal Grandmother        scolosis  . Stroke Paternal Grandfather         x 2  . Skin cancer Paternal Grandfather     Social History   Social History Narrative  . Not on file   Social History   Tobacco Use  . Smoking status: Never Smoker  . Smokeless tobacco: Never Used  Substance Use Topics  . Alcohol use: Yes    Comment: occasional     Current Meds  Medication Sig  . cholecalciferol (VITAMIN D3) 25 MCG (1000 UT) tablet Take 5,000 Units by mouth daily.  Marland Kitchen FLUoxetine (PROZAC) 20 MG tablet Take 20 mg by mouth daily.  Marland Kitchen levothyroxine (SYNTHROID) 25 MCG tablet Take 1 tablet (25 mcg total) by mouth daily.  . Multiple Vitamins-Minerals (MULTIVITAMIN WITH MINERALS) tablet Take 1 tablet by mouth daily.  . [DISCONTINUED] levothyroxine (SYNTHROID) 25 MCG tablet Take 1 tablet (25 mcg total) by mouth daily.    ROS:  Review of Systems  Constitutional: Negative.   Respiratory: Negative.   Cardiovascular: Negative.   Neurological: Negative.      Objective:   Today's Vitals: BP 104/68   Pulse 76   Temp 97.9 F (36.6 C) (  Temporal)   Ht 5' 3.3" (1.608 m)   Wt 136 lb 3.2 oz (61.8 kg)   SpO2 98%   BMI 23.90 kg/m  Vitals with BMI 07/20/2020 01/26/2020 07/13/2019  Height 5' 3.3" 5\' 5"  5\' 5"   Weight 136 lbs 3 oz 133 lbs 120 lbs  BMI 23.89 22.13 19.97  Systolic 104 125  Diastolic 68 80 70  Pulse 76 78 86     Physical Exam Vitals reviewed. Exam conducted with a chaperone present.  Constitutional:      Appearance: Normal appearance.  HENT:     Head: Normocephalic and atraumatic.     Right Ear: Tympanic membrane, ear canal and external ear normal.     Left Ear: Tympanic membrane, ear canal and external ear normal.  Eyes:     General:        Right eye: No discharge.        Left eye: No discharge.     Extraocular Movements: Extraocular movements intact.     Conjunctiva/sclera: Conjunctivae normal.     Pupils: Pupils are equal, round, and reactive to light.  Neck:     Vascular: No carotid bruit.  Cardiovascular:     Rate and Rhythm: Normal  rate and regular rhythm.     Pulses: Normal pulses.     Heart sounds: Normal heart sounds. No murmur heard.   Pulmonary:     Effort: Pulmonary effort is normal.     Breath sounds: Normal breath sounds.  Chest:     Breasts: Breasts are symmetrical.        Right: Normal.        Left: Normal.  Abdominal:     General: Abdomen is flat. Bowel sounds are normal. There is no distension.     Palpations: Abdomen is soft. There is no mass.     Tenderness: There is no abdominal tenderness.  Musculoskeletal:        General: No tenderness.     Cervical back: Neck supple. No muscular tenderness.     Right lower leg: No edema.     Left lower leg: No edema.  Lymphadenopathy:     Cervical: No cervical adenopathy.     Upper Body:     Right upper body: No supraclavicular adenopathy.     Left upper body: No supraclavicular adenopathy.  Skin:    General: Skin is warm and dry.  Neurological:     General: No focal deficit present.     Mental Status: She is alert and oriented to person, place, and time.     Motor: No weakness.     Gait: Gait normal.  Psychiatric:        Mood and Affect: Mood normal.        Behavior: Behavior normal.        Judgment: Judgment normal.      PHQ9 SCORE ONLY 07/20/2020  PHQ-9 Total Score 0       Assessment and Plan   1. Encounter for general adult medical examination with abnormal findings   2. Hypothyroidism, unspecified type   3. Hyperlipidemia, unspecified hyperlipidemia type   4. Needs flu shot   5. Moderate episode of recurrent major depressive disorder ([facility])      Plan: 1.,  4.  We will administer flu shot today.  Did discuss that she can go ahead and get her COVID-19 vaccine booster shot administered at any time.  She is also encouraged to call the hospital to get her mammogram  rescheduled.  She tells me she plans on doing this.  Depression screen was negative today. 2.  We will refill her levothyroxine to pharmacy and she will return in about 6  weeks to have blood work checked. 3.  We discussed lifestyle and I recommended she focus on a healthy diet aimed at helping improve her hyperlipidemia. 5.  She will continue on her antidepressant as currently prescribed.   Tests ordered No orders of the defined types were placed in this encounter.     Meds ordered this encounter  Medications  . levothyroxine (SYNTHROID) 25 MCG tablet    Sig: Take 1 tablet (25 mcg total) by mouth daily.    Dispense:  30 tablet    Refill:  3    Order Specific Question:   Supervising Provider    Answer:   Wilson Singer [[id number]]    Patient to follow-up in 6 weeks or sooner as needed.  In addition to performing her annual physical exam also performed an office visit to address her chronic conditions.  Elenore Paddy, NP

## 2020-08-04 ENCOUNTER — Ambulatory Visit: Payer: BC Managed Care – PPO | Attending: Internal Medicine

## 2020-08-04 DIAGNOSIS — Z23 Encounter for immunization: Secondary | ICD-10-CM

## 2020-08-04 NOTE — Progress Notes (Signed)
° °  Covid-19 Vaccination Clinic  Name:  Jessica Klein    MRN: [id number] DOB: 15-Jan-1976  08/04/2020  [name] was observed post Covid-19 immunization for 15 minutes without incident. She was provided with Vaccine Information Sheet and instruction to access the V-Safe system.   [name] was instructed to call 911 with any severe reactions post vaccine:  Difficulty breathing   Swelling of face and throat   A fast heartbeat   A bad rash all over body   Dizziness and weakness   Immunizations Administered    No immunizations on file.

## 2020-08-24 ENCOUNTER — Other Ambulatory Visit: Payer: Self-pay

## 2020-08-24 ENCOUNTER — Encounter (INDEPENDENT_AMBULATORY_CARE_PROVIDER_SITE_OTHER): Payer: Self-pay | Admitting: Nurse Practitioner

## 2020-08-24 ENCOUNTER — Ambulatory Visit (INDEPENDENT_AMBULATORY_CARE_PROVIDER_SITE_OTHER): Payer: BC Managed Care – PPO | Admitting: Nurse Practitioner

## 2020-08-24 VITALS — BP 98/72 | HR 72 | Temp 97.8°F | Ht 63.3 in | Wt 138.2 lb

## 2020-08-24 DIAGNOSIS — E039 Hypothyroidism, unspecified: Secondary | ICD-10-CM | POA: Diagnosis not present

## 2020-08-24 DIAGNOSIS — F331 Major depressive disorder, recurrent, moderate: Secondary | ICD-10-CM

## 2020-08-24 NOTE — Progress Notes (Signed)
Subjective:  Patient ID: Jessica Klein, female    DOB: 11-May-1976  Age: 44 y.o. MRN: [id number]  CC:  Chief Complaint  Patient presents with  . Follow-up    patient states she is doing well  . Hypothyroidism  . Depression      HPI  This patient arrives today for the above.  Hypothyroidism: Continues on levothyroxine 25 mcg daily.  Last TSH 6.16.  She is due to have thyroid panel checked today.  She tells me overall she is feeling well and is tolerating levothyroxine but does notice that she has been having some more joint pain hearing there.  She also mentioned she is been having some weight gain.  Depression: Continues on Prozac 20 mg daily.  Tolerating this well.    Past Medical History:  Diagnosis Date  . Congenital deformity of spine   . Depression   . Enlarged thyroid 12/16/2014  . Hypothyroidism   . OCD (obsessive compulsive disorder)   . Scoliosis       Family History  Problem Relation Age of Onset  . Thyroid disease Mother   . Hypertension Mother   . Other Mother        sleep apnea; degenerative disc  . Breast cancer Mother 22  . Glaucoma Mother   . Cancer Father        melonoma  . Hypertension Father   . Thyroid disease Brother        hyperthyroidism  . Heart attack Maternal Grandmother   . Hypertension Maternal Grandmother   . Other Maternal Grandmother        osteoporosis  . Other Maternal Grandfather        aneursym  . Stroke Maternal Grandfather   . Cancer Paternal Grandmother        breast  . Thyroid disease Paternal Grandmother   . Other Paternal Grandmother        scolosis  . Stroke Paternal Grandfather        x 2  . Skin cancer Paternal Grandfather     Social History   Social History Narrative  . Not on file   Social History   Tobacco Use  . Smoking status: Never Smoker  . Smokeless tobacco: Never Used  Substance Use Topics  . Alcohol use: Yes    Comment: occasional     Current Meds  Medication Sig  .  cholecalciferol (VITAMIN D3) 25 MCG (1000 UT) tablet Take 5,000 Units by mouth daily.  Marland Kitchen FLUoxetine (PROZAC) 20 MG tablet Take 20 mg by mouth daily.  Marland Kitchen levothyroxine (SYNTHROID) 25 MCG tablet Take 1 tablet (25 mcg total) by mouth daily.  . Multiple Vitamins-Minerals (MULTIVITAMIN WITH MINERALS) tablet Take 1 tablet by mouth daily.    ROS:  Review of Systems  Constitutional: Negative for fever and malaise/fatigue.  Respiratory: Negative for shortness of breath.   Cardiovascular: Negative for chest pain and palpitations.  Psychiatric/Behavioral: Negative for depression and suicidal ideas. The patient has insomnia (resolved). The patient is not nervous/anxious.      Objective:   Today's Vitals: BP 98/72   Pulse 72   Temp 97.8 F (36.6 C) (Temporal)   Ht 5' 3.3" (1.608 m)   Wt 138 lb 3.2 oz (62.7 kg)   SpO2 98%   BMI 24.25 kg/m  Vitals with BMI 08/24/2020 07/20/2020 01/26/2020  Height 5' 3.3" 5' 3.3" 5\' 5"   Weight 138 lbs 3 oz 136 lbs 3 oz 133 lbs  BMI 24.24  23.89 22.13  Systolic 98 104 125  Diastolic 72 68 80  Pulse 72 76 78     Physical Exam Vitals reviewed.  Constitutional:      General: She is not in acute distress.    Appearance: Normal appearance.  HENT:     Head: Normocephalic and atraumatic.  Neck:     Vascular: No carotid bruit.  Cardiovascular:     Rate and Rhythm: Normal rate and regular rhythm.     Pulses: Normal pulses.     Heart sounds: Normal heart sounds.  Pulmonary:     Effort: Pulmonary effort is normal.     Breath sounds: Normal breath sounds.  Skin:    General: Skin is warm and dry.  Neurological:     General: No focal deficit present.     Mental Status: She is alert and oriented to person, place, and time.  Psychiatric:        Mood and Affect: Mood normal.        Behavior: Behavior normal.        Judgment: Judgment normal.          Assessment and Plan   1. Hypothyroidism, unspecified type   2. Moderate episode of recurrent major  depressive disorder ([facility])      Plan: 1.  We will check thyroid panel today.  We will determine whether or not levothyroxine dose has to be adjusted based on these results. 2.  She will continue on Prozac as prescribed.   Tests ordered Orders Placed This Encounter  Procedures  . TSH  . T3, Free  . T4, Free      No orders of the defined types were placed in this encounter.   Patient to follow-up in 3 months or sooner as needed.  Elenore Paddy, NP

## 2020-08-25 ENCOUNTER — Other Ambulatory Visit (INDEPENDENT_AMBULATORY_CARE_PROVIDER_SITE_OTHER): Payer: Self-pay | Admitting: Nurse Practitioner

## 2020-08-25 DIAGNOSIS — E039 Hypothyroidism, unspecified: Secondary | ICD-10-CM

## 2020-08-25 LAB — T3, FREE: T3, Free: 2.9 pg/mL (ref 2.3–4.2)

## 2020-08-25 LAB — TSH: TSH: 5.79 mIU/L — ABNORMAL HIGH

## 2020-08-25 LAB — T4, FREE: Free T4: 1 ng/dL (ref 0.8–1.8)

## 2020-08-25 MED ORDER — LEVOTHYROXINE SODIUM 50 MCG PO TABS: 50.0000 ug | ORAL_TABLET | Freq: Every day | ORAL | 0 refills | Status: DC

## 2020-09-14 ENCOUNTER — Ambulatory Visit (HOSPITAL_COMMUNITY): Payer: BC Managed Care – PPO

## 2020-09-22 ENCOUNTER — Ambulatory Visit (HOSPITAL_COMMUNITY)
Admission: RE | Admit: 2020-09-22 | Discharge: 2020-09-22 | Disposition: A | Discharge status: Home / Self Care | Payer: BC Managed Care – PPO | Source: Ambulatory Visit | Attending: Nurse Practitioner | Admitting: Nurse Practitioner

## 2020-09-22 ENCOUNTER — Other Ambulatory Visit: Payer: Self-pay

## 2020-09-22 DIAGNOSIS — Z1231 Encounter for screening mammogram for malignant neoplasm of breast: Secondary | ICD-10-CM | POA: Diagnosis not present

## 2020-10-13 ENCOUNTER — Other Ambulatory Visit (INDEPENDENT_AMBULATORY_CARE_PROVIDER_SITE_OTHER): Payer: BC Managed Care – PPO

## 2020-10-13 ENCOUNTER — Other Ambulatory Visit (INDEPENDENT_AMBULATORY_CARE_PROVIDER_SITE_OTHER): Payer: Self-pay | Admitting: Internal Medicine

## 2020-10-13 ENCOUNTER — Other Ambulatory Visit: Payer: Self-pay

## 2020-10-13 DIAGNOSIS — E039 Hypothyroidism, unspecified: Secondary | ICD-10-CM | POA: Diagnosis not present

## 2020-10-14 LAB — T3, FREE: T3, Free: 2.8 pg/mL (ref 2.3–4.2)

## 2020-10-14 LAB — T4, FREE: Free T4: 1.1 ng/dL (ref 0.8–1.8)

## 2020-10-14 LAB — TSH: TSH: 2.32 mIU/L

## 2020-11-23 ENCOUNTER — Ambulatory Visit (INDEPENDENT_AMBULATORY_CARE_PROVIDER_SITE_OTHER): Payer: BC Managed Care – PPO | Admitting: Internal Medicine

## 2020-11-28 ENCOUNTER — Ambulatory Visit (INDEPENDENT_AMBULATORY_CARE_PROVIDER_SITE_OTHER): Payer: BC Managed Care – PPO | Admitting: Internal Medicine

## 2020-11-28 ENCOUNTER — Other Ambulatory Visit: Payer: Self-pay

## 2020-11-28 ENCOUNTER — Encounter (INDEPENDENT_AMBULATORY_CARE_PROVIDER_SITE_OTHER): Payer: Self-pay | Admitting: Internal Medicine

## 2020-11-28 VITALS — BP 106/76 | HR 84 | Temp 97.9°F | Ht 63.3 in | Wt 137.0 lb

## 2020-11-28 DIAGNOSIS — E039 Hypothyroidism, unspecified: Secondary | ICD-10-CM

## 2020-11-28 MED ORDER — LEVOTHYROXINE SODIUM 50 MCG PO TABS: 50.0000 ug | ORAL_TABLET | Freq: Every day | ORAL | 1 refills | Status: DC

## 2020-11-28 NOTE — Progress Notes (Signed)
Metrics: Intervention Frequency ACO  Documented Smoking Status Yearly  Screened one or more times in 24 months  Cessation Counseling or  Active cessation medication Past 24 months  Past 24 months   Guideline developer: UpToDate (See UpToDate for funding source) Date Released: 2014       Wellness Office Visit  Subjective:  Patient ID: Jessica Klein, female    DOB: 1976/08/22  Age: 45 y.o. MRN: [id number]  CC: This lady comes in for follow-up of hypothyroidism. HPI  She is doing reasonably well and continues on levothyroxine 50 mcg daily. Past Medical History:  Diagnosis Date  . Congenital deformity of spine   . Depression   . Enlarged thyroid 12/16/2014  . Hypothyroidism   . OCD (obsessive compulsive disorder)   . Scoliosis    Past Surgical History:  Procedure Laterality Date  . arm surgery Right    cyst removed  . BACK SURGERY     rod, 3 screws for scoliosis  . LEG SURGERY Right    birth mark removed  . WISDOM TOOTH EXTRACTION       Family History  Problem Relation Age of Onset  . Thyroid disease Mother   . Hypertension Mother   . Other Mother        sleep apnea; degenerative disc  . Breast cancer Mother 2  . Glaucoma Mother   . Cancer Father        melonoma  . Hypertension Father   . Thyroid disease Brother        hyperthyroidism  . Heart attack Maternal Grandmother   . Hypertension Maternal Grandmother   . Other Maternal Grandmother        osteoporosis  . Other Maternal Grandfather        aneursym  . Stroke Maternal Grandfather   . Cancer Paternal Grandmother        breast  . Thyroid disease Paternal Grandmother   . Other Paternal Grandmother        scolosis  . Stroke Paternal Grandfather        x 2  . Skin cancer Paternal Grandfather     Social History   Social History Narrative  . Not on file   Social History   Tobacco Use  . Smoking status: Never Smoker  . Smokeless tobacco: Never Used  Substance Use Topics  . Alcohol use: Yes     Comment: occasional    Current Meds  Medication Sig  . cholecalciferol (VITAMIN D3) 25 MCG (1000 UT) tablet Take 5,000 Units by mouth daily.  Marland Kitchen FLUoxetine (PROZAC) 20 MG tablet Take 20 mg by mouth daily.  . Multiple Vitamins-Minerals (MULTIVITAMIN WITH MINERALS) tablet Take 1 tablet by mouth daily.  . [DISCONTINUED] levothyroxine (SYNTHROID) 50 MCG tablet Take 1 tablet (50 mcg total) by mouth daily.     Flowsheet Row Office Visit from 11/28/2020 in [facility]  PHQ-9 Total Score 0      Objective:   Today's Vitals: BP 106/76   Pulse 84   Temp 97.9 F (36.6 C) (Temporal)   Ht 5' 3.3" (1.608 m)   Wt 137 lb (62.1 kg)   SpO2 98%   BMI 24.04 kg/m  Vitals with BMI 11/28/2020 08/24/2020 07/20/2020  Height 5' 3.3" 5' 3.3" 5' 3.3"  Weight 137 lbs 138 lbs 3 oz 136 lbs 3 oz  BMI 24.03 24.24 23.89  Systolic 106 98 104  Diastolic 76 72 68  Pulse 84 72 76     Physical  Exam  She looks systemically well.  Weight is stable.  Blood pressure is excellent.     Assessment   1. Hypothyroidism, unspecified type       Tests ordered No orders of the defined types were placed in this encounter.    Plan: 1. She will continue with levothyroxine 50 mcg daily.  I have refilled this medication today.  I did mention that her free T3 levels are in my opinion from suboptimal and I offered Cytomel.  She will think about it. 2. She will follow up with Maralyn Sago in November for an annual physical exam.   Meds ordered this encounter  Medications  . levothyroxine (SYNTHROID) 50 MCG tablet    Sig: Take 1 tablet (50 mcg total) by mouth daily.    Dispense:  90 tablet    Refill:  1    Martika Egler Normajean Glasgow, MD

## 2021-04-10 ENCOUNTER — Encounter (INDEPENDENT_AMBULATORY_CARE_PROVIDER_SITE_OTHER): Payer: Self-pay

## 2021-04-10 NOTE — Progress Notes (Unsigned)
Vaccines in chart given in the History.   Influzena 07/20/2020, 07/13/2019

## 2021-04-11 ENCOUNTER — Ambulatory Visit (INDEPENDENT_AMBULATORY_CARE_PROVIDER_SITE_OTHER): Payer: BC Managed Care – PPO

## 2021-04-11 ENCOUNTER — Other Ambulatory Visit: Payer: Self-pay

## 2021-04-11 DIAGNOSIS — Z111 Encounter for screening for respiratory tuberculosis: Secondary | ICD-10-CM

## 2021-04-11 NOTE — Progress Notes (Signed)
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 PPD injected 1.0 unit underneath skin on the right forearm. Nice bubble wheel. Applied bandage. Will be back on Thursday for Lorelee New to read and write note and document.

## 2021-04-13 ENCOUNTER — Ambulatory Visit (INDEPENDENT_AMBULATORY_CARE_PROVIDER_SITE_OTHER): Payer: BC Managed Care – PPO | Admitting: Nurse Practitioner

## 2021-04-13 ENCOUNTER — Other Ambulatory Visit: Payer: Self-pay

## 2021-04-13 DIAGNOSIS — Z111 Encounter for screening for respiratory tuberculosis: Secondary | ICD-10-CM

## 2021-04-13 IMAGING — MG DIGITAL SCREENING BILAT W/ TOMO W/ CAD
8 series · 9 of 24 positions shown · non-contrast
Comparison: Previous exam(s).

CLINICAL DATA: Screening.

EXAM:
DIGITAL SCREENING BILATERAL MAMMOGRAM WITH TOMO AND CAD

[R CC synth-2D]
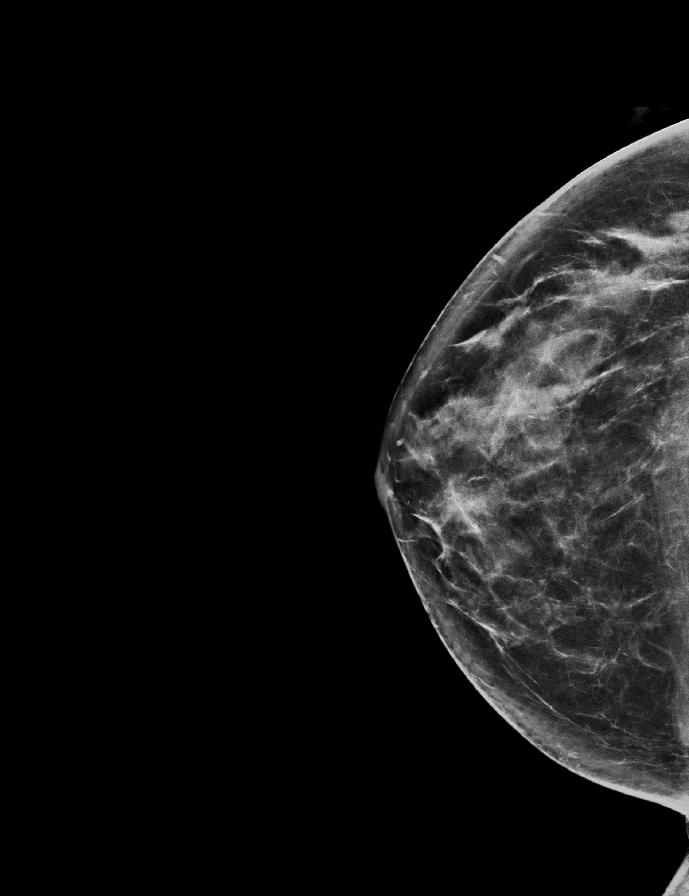

[L MLO synth-2D]
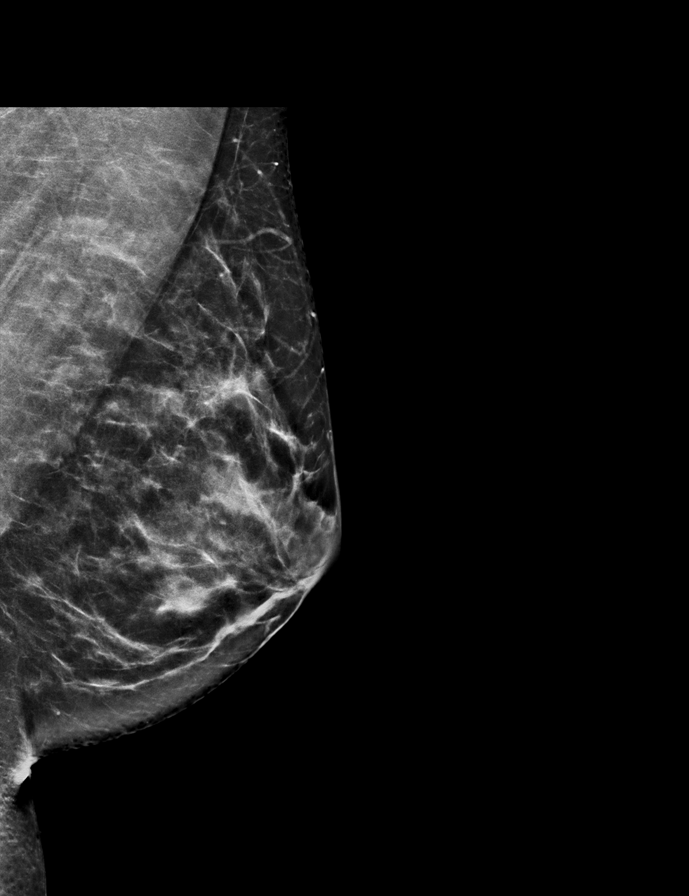

[L CC synth-2D]
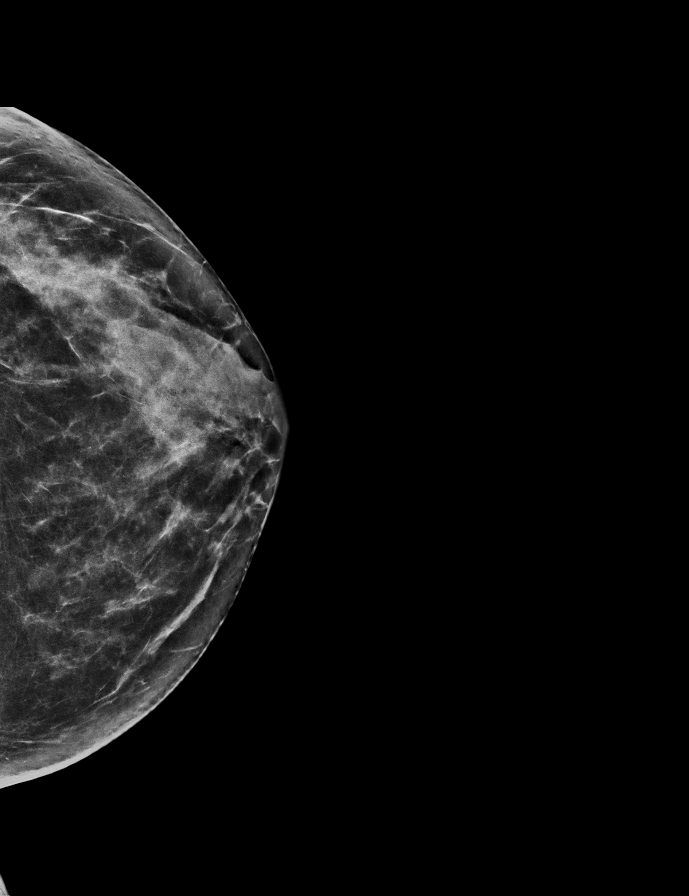

[R MLO synth-2D]
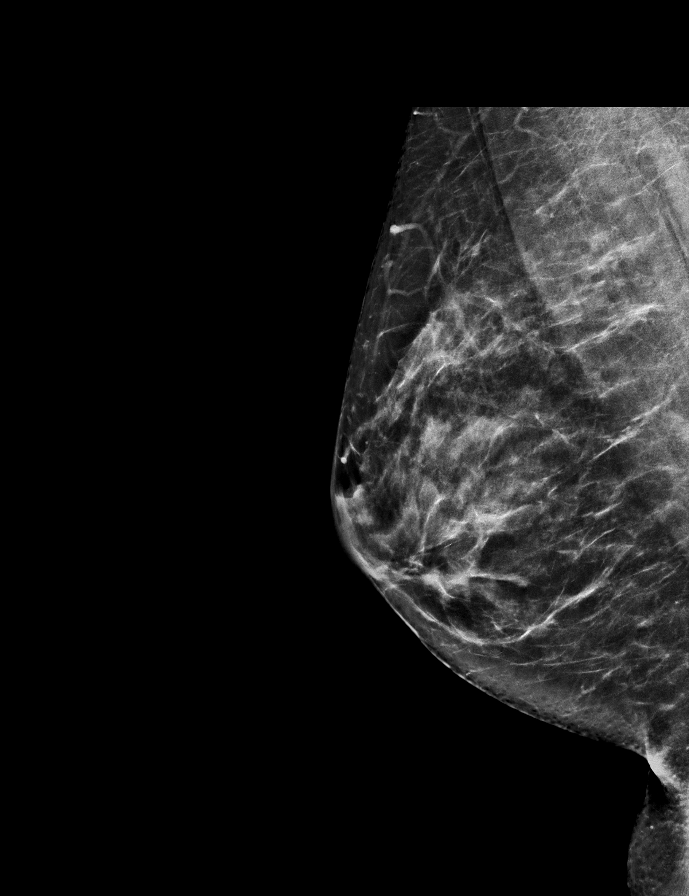

[R CC tomo · 2 of 69 frames shown]
[frame 23/69]
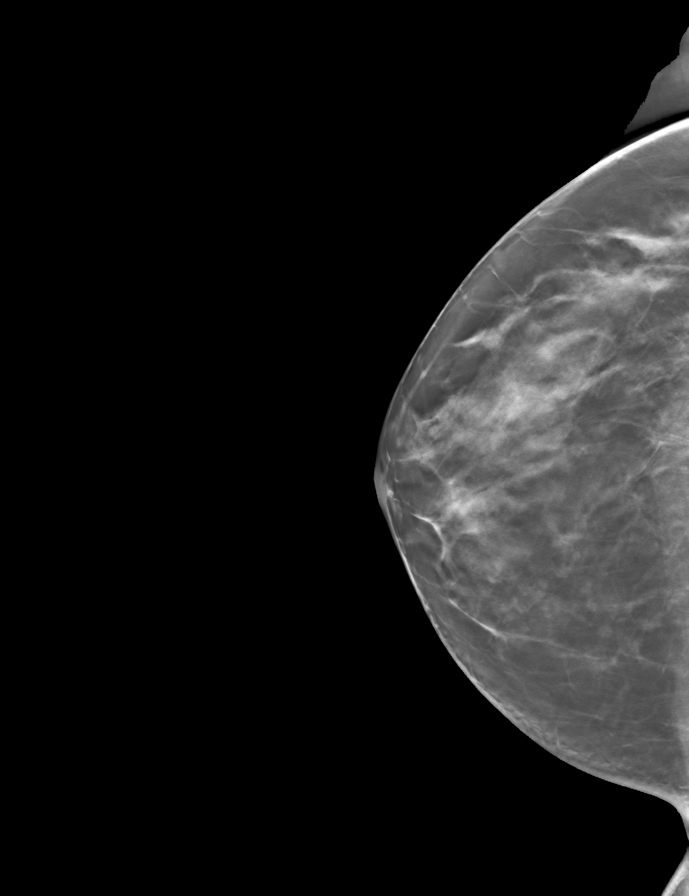
[frame 35/69]
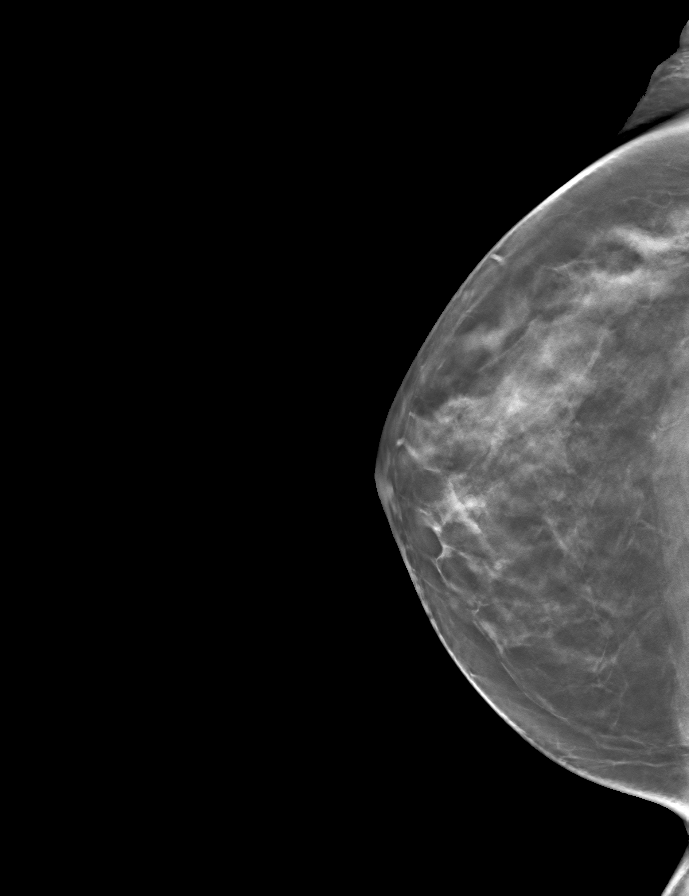

[L CC tomo · tomo slice 33/65.0]
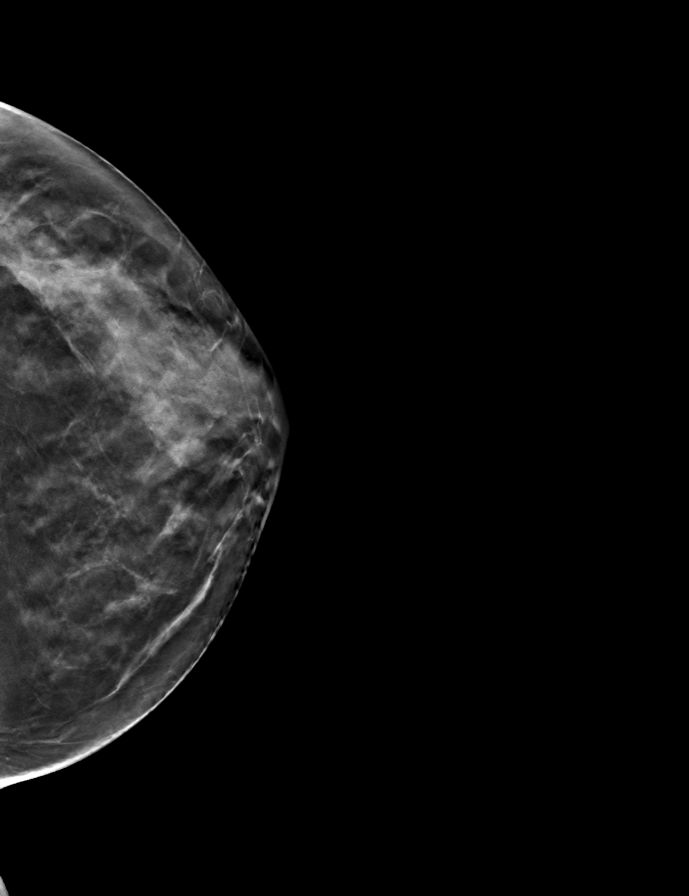

[R MLO tomo · tomo slice 33/65.0]
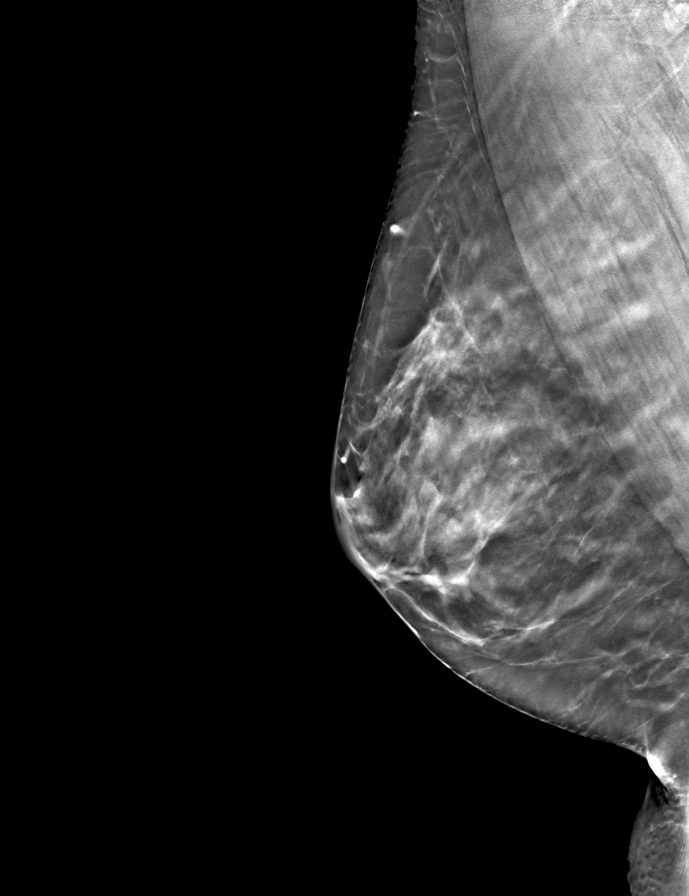

[L MLO tomo · tomo slice 34/67.0]
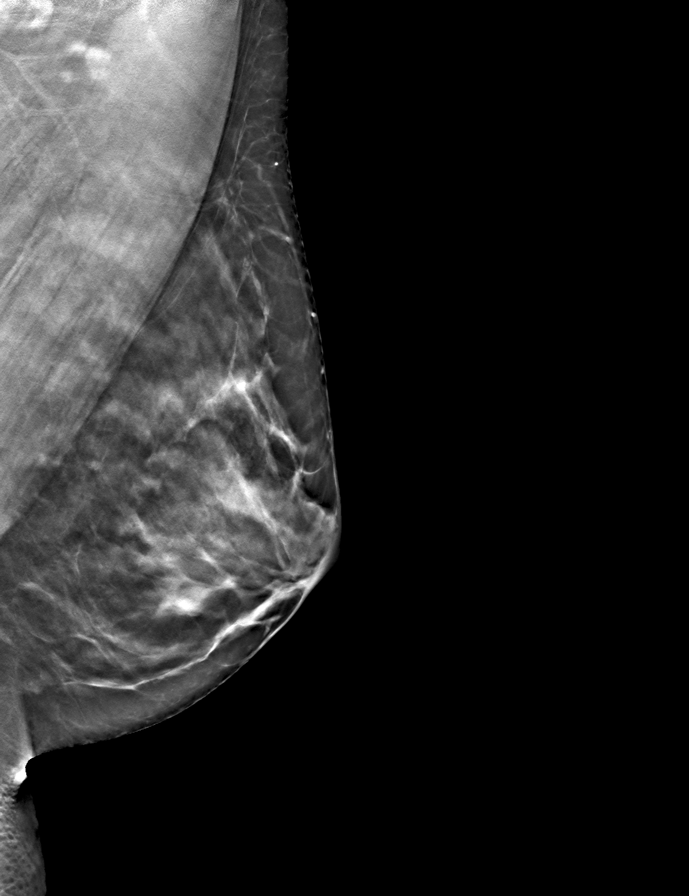

[9 of 24 positions shown; findings below may reference images not displayed]

ACR Breast Density Category c: The breast tissue is heterogeneously
dense, which may obscure small masses.
FINDINGS: There are no findings suspicious for malignancy. Images were
processed with CAD.
IMPRESSION: No mammographic evidence of malignancy. A result letter of this
screening mammogram will be mailed directly to the patient.

RECOMMENDATION:
Screening mammogram in one year. (Code:FT-U-LHB)

BI-RADS CATEGORY  1: Negative.

## 2021-04-13 NOTE — Progress Notes (Signed)
PPD read: no induration noted. This indicates a negative result. Paperwork for patient's employer filled out, signed, and given to patient.

## 2021-07-26 ENCOUNTER — Encounter (INDEPENDENT_AMBULATORY_CARE_PROVIDER_SITE_OTHER): Payer: BC Managed Care – PPO | Admitting: Nurse Practitioner

## 2021-10-05 ENCOUNTER — Other Ambulatory Visit: Payer: Self-pay

## 2021-10-05 ENCOUNTER — Encounter: Payer: Self-pay | Admitting: Nurse Practitioner

## 2021-10-05 ENCOUNTER — Other Ambulatory Visit: Payer: Self-pay | Admitting: Nurse Practitioner

## 2021-10-05 ENCOUNTER — Ambulatory Visit (INDEPENDENT_AMBULATORY_CARE_PROVIDER_SITE_OTHER): Payer: BC Managed Care – PPO | Admitting: Nurse Practitioner

## 2021-10-05 VITALS — BP 120/82 | HR 78 | Temp 98.1°F | Ht 63.3 in | Wt 140.0 lb

## 2021-10-05 DIAGNOSIS — E039 Hypothyroidism, unspecified: Secondary | ICD-10-CM

## 2021-10-05 DIAGNOSIS — Z0000 Encounter for general adult medical examination without abnormal findings: Secondary | ICD-10-CM | POA: Diagnosis not present

## 2021-10-05 DIAGNOSIS — E559 Vitamin D deficiency, unspecified: Secondary | ICD-10-CM

## 2021-10-05 DIAGNOSIS — E785 Hyperlipidemia, unspecified: Secondary | ICD-10-CM | POA: Diagnosis not present

## 2021-10-05 DIAGNOSIS — Z131 Encounter for screening for diabetes mellitus: Secondary | ICD-10-CM | POA: Diagnosis not present

## 2021-10-05 DIAGNOSIS — F419 Anxiety disorder, unspecified: Secondary | ICD-10-CM

## 2021-10-05 DIAGNOSIS — Z0001 Encounter for general adult medical examination with abnormal findings: Secondary | ICD-10-CM

## 2021-10-05 LAB — HEMOGLOBIN A1C: Hgb A1c MFr Bld: 5.4 % (ref 4.6–6.5)

## 2021-10-05 LAB — COMPREHENSIVE METABOLIC PANEL
ALT: 12 U/L (ref 0–35)
AST: 18 U/L (ref 0–37)
Albumin: 4.2 g/dL (ref 3.5–5.2)
Alkaline Phosphatase: 49 U/L (ref 39–117)
BUN: 11 mg/dL (ref 6–23)
CO2: 28 mEq/L (ref 19–32)
Calcium: 8.8 mg/dL (ref 8.4–10.5)
Chloride: 102 mEq/L (ref 96–112)
Creatinine, Ser: 0.85 mg/dL (ref 0.40–1.20)
GFR: 83.67 mL/min (ref >60.00)
Glucose, Bld: 79 mg/dL (ref 70–99)
Potassium: 4 mEq/L (ref 3.5–5.1)
Sodium: 136 mEq/L (ref 135–145)
Total Bilirubin: 0.5 mg/dL (ref 0.2–1.2)
Total Protein: 6.7 g/dL (ref 6.0–8.3)

## 2021-10-05 LAB — CBC WITH DIFFERENTIAL/PLATELET
Basophils Absolute: 0 10*3/uL (ref 0.0–0.1)
Basophils Relative: 0.8 % (ref 0.0–3.0)
Eosinophils Absolute: 0.1 10*3/uL (ref 0.0–0.7)
Eosinophils Relative: 1.1 % (ref 0.0–5.0)
HCT: 38.3 % (ref 36.0–46.0)
Hemoglobin: 12.9 g/dL (ref 12.0–15.0)
Lymphocytes Relative: 29.2 % (ref 12.0–46.0)
Lymphs Abs: 1.7 10*3/uL (ref 0.7–4.0)
MCHC: 33.7 g/dL (ref 30.0–36.0)
MCV: 88.9 fl (ref 78.0–100.0)
Monocytes Absolute: 0.4 10*3/uL (ref 0.1–1.0)
Monocytes Relative: 6.7 % (ref 3.0–12.0)
Neutro Abs: 3.6 10*3/uL (ref 1.4–7.7)
Neutrophils Relative %: 62.2 % (ref 43.0–77.0)
Platelets: 372 10*3/uL (ref 150.0–400.0)
RBC: 4.31 Mil/uL (ref 3.87–5.11)
RDW: 12.8 % (ref 11.5–15.5)
WBC: 5.8 10*3/uL (ref 4.0–10.5)

## 2021-10-05 LAB — T3, FREE: T3, Free: 3.4 pg/mL (ref 2.3–4.2)

## 2021-10-05 LAB — LIPID PANEL
Cholesterol: 234 mg/dL — ABNORMAL HIGH (ref 0–200)
HDL: 71.1 mg/dL (ref >39.00)
LDL Cholesterol: 150 mg/dL — ABNORMAL HIGH (ref 0–99)
NonHDL: 163.21
Total CHOL/HDL Ratio: 3
Triglycerides: 66 mg/dL (ref 0.0–149.0)
VLDL: 13.2 mg/dL (ref 0.0–40.0)

## 2021-10-05 LAB — VITAMIN D 25 HYDROXY (VIT D DEFICIENCY, FRACTURES): VITD: 57.51 ng/mL (ref 30.00–100.00)

## 2021-10-05 LAB — T4, FREE: Free T4: 0.71 ng/dL (ref 0.60–1.60)

## 2021-10-05 LAB — TSH: TSH: 3.99 u[IU]/mL (ref 0.35–5.50)

## 2021-10-05 MED ORDER — LEVOTHYROXINE SODIUM 25 MCG PO CAPS: 1.0000 | ORAL_CAPSULE | ORAL | 3 refills | Status: DC

## 2021-10-05 MED ORDER — LEVOTHYROXINE SODIUM 25 MCG PO TABS: 25.0000 ug | ORAL_TABLET | ORAL | 3 refills | Status: DC

## 2021-10-05 MED ORDER — FLUOXETINE HCL 20 MG PO TABS: 20.0000 mg | ORAL_TABLET | Freq: Every day | ORAL | 1 refills | Status: DC

## 2021-10-05 NOTE — Addendum Note (Signed)
Addended by: Elenore Paddy on: 10/05/2021 05:24 PM   Modules accepted: Orders

## 2021-10-05 NOTE — Progress Notes (Signed)
Subjective:  Patient ID: Jessica Klein, female    DOB: 09-Feb-1976  Age: 46 y.o. MRN: [id number]  CC:  Chief Complaint  Patient presents with   Annual Exam      HPI  This patient arrives today for the above.  She is due for routine lab work.  She is also due for mammogram and Pap smear.  She tells me that she feels comfortable scheduling her own mammogram and calling her OB for Pap smear.  She has no acute complaints today.  She does have a history of hypothyroidism and tells me that she is low on her levothyroxine so she been taking 25 mcg by mouth every other day.  She has a history of hair loss which we believe is related to her hypothyroidism, however she tells me over the last few weeks her hair loss seems to have slowed.  She also has a history of anxiety for which she takes Prozac.  Tells me she tolerated medication well.  She does follow with psychiatry but admits that she is overdue for follow-up appoint with them.  She does need a refill on her Prozac.  Past Medical History:  Diagnosis Date   Congenital deformity of spine    Depression    Enlarged thyroid 12/16/2014   Hypothyroidism    OCD (obsessive compulsive disorder)    Scoliosis       Family History  Problem Relation Age of Onset   Thyroid disease Mother    Hypertension Mother    Other Mother        sleep apnea; degenerative disc   Breast cancer Mother 32   Glaucoma Mother    Cancer Father        melonoma   Hypertension Father    Thyroid disease Brother        hyperthyroidism   Heart attack Maternal Grandmother    Hypertension Maternal Grandmother    Other Maternal Grandmother        osteoporosis   Other Maternal Grandfather        aneursym   Stroke Maternal Grandfather    Cancer Paternal Grandmother        breast   Thyroid disease Paternal Grandmother    Other Paternal Grandmother        scolosis   Stroke Paternal Grandfather        x 2   Skin cancer Paternal Grandfather     Social  History   Social History Narrative   Not on file   Social History   Tobacco Use   Smoking status: Never   Smokeless tobacco: Never  Substance Use Topics   Alcohol use: Yes    Comment: occasional     Current Meds  Medication Sig   cholecalciferol (VITAMIN D3) 25 MCG (1000 UT) tablet Take 5,000 Units by mouth daily.   levothyroxine (SYNTHROID) 25 MCG tablet Take 25 mcg by mouth every other day.   Multiple Vitamins-Minerals (MULTIVITAMIN WITH MINERALS) tablet Take 1 tablet by mouth daily.   [DISCONTINUED] FLUoxetine (PROZAC) 20 MG tablet Take 20 mg by mouth daily.   [DISCONTINUED] levothyroxine (SYNTHROID) 50 MCG tablet Take 1 tablet (50 mcg total) by mouth daily.    ROS:  Review of Systems  Constitutional:  Negative for malaise/fatigue and weight loss.  Respiratory:  Negative for shortness of breath and wheezing.   Cardiovascular:  Negative for chest pain.  Gastrointestinal:  Negative for abdominal pain and blood in stool.  Neurological:  Negative  for dizziness, seizures, loss of consciousness and headaches.  Psychiatric/Behavioral:  Negative for depression and suicidal ideas. The patient is not nervous/anxious.     Objective:   Today's Vitals: BP 120/82 (BP Location: Left Arm, Patient Position: Sitting, Cuff Size: Normal)    Pulse 78    Temp 98.1 F (36.7 C) (Oral)    Ht 5' 3.3" (1.608 m)    Wt 140 lb (63.5 kg)    SpO2 99%    BMI 24.57 kg/m  Vitals with BMI 10/05/2021 11/28/2020 08/24/2020  Height 5' 3.3" 5' 3.3" 5' 3.3"  Weight 140 lbs 137 lbs 138 lbs 3 oz  BMI 24.56 0000000 [id number]  Systolic [id number] 98  Diastolic 82 76 72  Pulse 78 84 72     Physical Exam Vitals reviewed.  Constitutional:      Appearance: Normal appearance.  HENT:     Head: Normocephalic and atraumatic.     Right Ear: Tympanic membrane, ear canal and external ear normal.     Left Ear: Tympanic membrane, ear canal and external ear normal.  Eyes:     General:        Right eye: No discharge.         Left eye: No discharge.     Extraocular Movements: Extraocular movements intact.     Conjunctiva/sclera: Conjunctivae normal.     Pupils: Pupils are equal, round, and reactive to light.  Neck:     Vascular: No carotid bruit.  Cardiovascular:     Rate and Rhythm: Normal rate and regular rhythm.     Pulses: Normal pulses.     Heart sounds: Normal heart sounds. No murmur heard. Pulmonary:     Effort: Pulmonary effort is normal.     Breath sounds: Normal breath sounds.  Chest:  Breasts:    Breasts are symmetrical.     Right: Normal.     Left: Normal.  Abdominal:     General: Abdomen is flat. Bowel sounds are normal. There is no distension.     Palpations: Abdomen is soft. There is no mass.     Tenderness: There is no abdominal tenderness.  Musculoskeletal:        General: No tenderness.     Cervical back: Neck supple. No muscular tenderness.     Right lower leg: No edema.     Left lower leg: No edema.  Lymphadenopathy:     Cervical: No cervical adenopathy.     Upper Body:     Right upper body: No supraclavicular adenopathy.     Left upper body: No supraclavicular adenopathy.  Skin:    General: Skin is warm and dry.  Neurological:     General: No focal deficit present.     Mental Status: She is alert and oriented to person, place, and time.     Motor: No weakness.     Gait: Gait normal.  Psychiatric:        Mood and Affect: Mood normal.        Behavior: Behavior normal.        Judgment: Judgment normal.         Assessment and Plan   1. Encounter for general adult medical examination with abnormal findings   2. Hypothyroidism, unspecified type   3. Hyperlipidemia, unspecified hyperlipidemia type   4. Screening for diabetes mellitus   5. Vitamin D deficiency disease   6. Anxiety      Plan: 1.-6.  We will check blood work today for  further evaluation.  She was encouraged to schedule her mammogram as well as call OB to have Pap smear completed.  We will plan on  refilling/prescribing levothyroxine based on thyroid levels.  Also discussed with patient that we may need to do repeat thyroid levels based on results and needed medication adjustments.  She is agreeable to this if needed.  We will also refill Prozac today for treatment of her anxiety.  She was encouraged to follow-up with psychiatry as well.   Tests ordered Orders Placed This Encounter  Procedures   TSH   Hemoglobin A1c   Lipid panel   Comprehensive metabolic panel   CBC with Differential/Platelet   T3, free   T4, free   VITAMIN D 25 Hydroxy (Vit-D Deficiency, Fractures)      Meds ordered this encounter  Medications   FLUoxetine (PROZAC) 20 MG tablet    Sig: Take 1 tablet (20 mg total) by mouth daily.    Dispense:  90 tablet    Refill:  1    Order Specific Question:   Supervising Provider    Answer:   Binnie Rail [id number]    Patient to follow-up in 6 months or sooner as needed.  Ailene Ards, NP

## 2021-10-13 ENCOUNTER — Other Ambulatory Visit: Payer: Self-pay | Admitting: Nurse Practitioner

## 2021-10-13 ENCOUNTER — Encounter: Payer: Self-pay | Admitting: Nurse Practitioner

## 2021-10-13 DIAGNOSIS — F419 Anxiety disorder, unspecified: Secondary | ICD-10-CM

## 2021-10-13 MED ORDER — FLUOXETINE HCL 20 MG PO CAPS: 20.0000 mg | ORAL_CAPSULE | Freq: Every day | ORAL | 1 refills | Status: DC

## 2021-10-30 ENCOUNTER — Telehealth (INDEPENDENT_AMBULATORY_CARE_PROVIDER_SITE_OTHER): Payer: BC Managed Care – PPO | Admitting: Internal Medicine

## 2021-10-30 ENCOUNTER — Encounter: Payer: Self-pay | Admitting: Internal Medicine

## 2021-10-30 VITALS — Wt 140.0 lb

## 2021-10-30 DIAGNOSIS — J069 Acute upper respiratory infection, unspecified: Secondary | ICD-10-CM

## 2021-10-30 NOTE — Progress Notes (Signed)
Virtual Visit via Video Note  I connected with Jessica Klein on 10/30/21 at  9:30 AM EST by a video enabled telemedicine application and verified that I am speaking with the correct person using two identifiers.  Location patient: home Location provider: work office Persons participating in the virtual visit: patient, provider  I discussed the limitations of evaluation and management by telemedicine and the availability of in person appointments. The patient expressed understanding and agreed to proceed.   HPI: She has scheduled this visit to discuss the presence of URI symptoms since last Thursday which was 5 days ago.  She started developing a sore throat that day as well as postnasal drip and loss of smell.  She has had some occasional bloody discharges but not frank epistaxis.  The day after on Friday she took a COVID test which was negative.  On Saturday morning she felt a little better but towards the afternoon started having sneezing and coughing.  She took another COVID test that day that was negative as well.  She started taking Mucinex which has helped.  She has not had a fever or shortness of breath.  She works at an [employer].   ROS: Constitutional: Denies fever, chills, diaphoresis, appetite change. HEENT: Denies photophobia, eye pain, redness, mouth sores, trouble swallowing, neck pain, neck stiffness and tinnitus.   Respiratory: Denies SOB, DOE,  chest tightness,  and wheezing.   Cardiovascular: Denies chest pain, palpitations and leg swelling.  Gastrointestinal: Denies nausea, vomiting, abdominal pain, diarrhea, constipation, blood in stool and abdominal distention.  Genitourinary: Denies dysuria, urgency, frequency, hematuria, flank pain and difficulty urinating.  Endocrine: Denies: hot or cold intolerance, sweats, changes in hair or nails, polyuria, polydipsia. Musculoskeletal: Denies myalgias, back pain, joint swelling, arthralgias and gait problem.  Skin:  Denies pallor, rash and wound.  Neurological: Denies dizziness, seizures, syncope, weakness, light-headedness, numbness and headaches.  Hematological: Denies adenopathy. Easy bruising, personal or family bleeding history  Psychiatric/Behavioral: Denies suicidal ideation, mood changes, confusion, nervousness, sleep disturbance and agitation   Past Medical History:  Diagnosis Date   Congenital deformity of spine    Depression    Enlarged thyroid 12/16/2014   Hypothyroidism    OCD (obsessive compulsive disorder)    Scoliosis     Past Surgical History:  Procedure Laterality Date   arm surgery Right    cyst removed   BACK SURGERY     rod, 3 screws for scoliosis   LEG SURGERY Right    birth mark removed   WISDOM TOOTH EXTRACTION      Family History  Problem Relation Age of Onset   Thyroid disease Mother    Hypertension Mother    Other Mother        sleep apnea; degenerative disc   Breast cancer Mother 80   Glaucoma Mother    Cancer Father        melonoma   Hypertension Father    Thyroid disease Brother        hyperthyroidism   Heart attack Maternal Grandmother    Hypertension Maternal Grandmother    Other Maternal Grandmother        osteoporosis   Other Maternal Grandfather        aneursym   Stroke Maternal Grandfather    Cancer Paternal Grandmother        breast   Thyroid disease Paternal Grandmother    Other Paternal Grandmother        scolosis   Stroke Paternal  Grandfather        x 2   Skin cancer Paternal Grandfather     SOCIAL HX:   reports that she has never smoked. She has never used smokeless tobacco. She reports current alcohol use. She reports that she does not use drugs.   Current Outpatient Medications:    cholecalciferol (VITAMIN D3) 25 MCG (1000 UT) tablet, Take 5,000 Units by mouth daily., Disp: , Rfl:    FLUoxetine (PROZAC) 20 MG capsule, Take 1 capsule (20 mg total) by mouth daily., Disp: 90 capsule, Rfl: 1   Levothyroxine Sodium 25 MCG CAPS,  Take 1 capsule (25 mcg total) by mouth every other day., Disp: 45 capsule, Rfl: 3   Multiple Vitamins-Minerals (MULTIVITAMIN WITH MINERALS) tablet, Take 1 tablet by mouth daily., Disp: , Rfl:   EXAM:   VITALS per patient if applicable: None reported  GENERAL: alert, oriented, appears well and in no acute distress  HEENT: atraumatic, conjunttiva clear, no obvious abnormalities on inspection of external nose and ears  NECK: normal movements of the head and neck  LUNGS: on inspection no signs of respiratory distress, breathing rate appears normal, no obvious gross increased work of breathing, gasping or wheezing  CV: no obvious cyanosis  MS: moves all visible extremities without noticeable abnormality  PSYCH/NEURO: pleasant and cooperative, no obvious depression or anxiety, speech and thought processing grossly intact  ASSESSMENT AND PLAN:   Viral upper respiratory tract infection -Given presentation, PNA, pharyngitis, ear infection are not likely, hence abx have not been prescribed. -Have advised rest, fluids, OTC antihistamines, cough suppressants and mucinex. -RTC if no improvement in 10-14 days. -Advised moist air humidifier for bedroom to help with bloody nasal secretions. -It is possible that her COVID tests are false negative as she took both of them early on in the course of her illness, however at this point it has been 5 days of symptom presents so see no need to repeat COVID testing.      I discussed the assessment and treatment plan with the patient. The patient was provided an opportunity to ask questions and all were answered. The patient agreed with the plan and demonstrated an understanding of the instructions.   The patient was advised to call back or seek an in-person evaluation if the symptoms worsen or if the condition fails to improve as anticipated.    Lelon Frohlich, MD  [facility]

## 2022-04-13 ENCOUNTER — Telehealth: Payer: Self-pay

## 2022-04-13 ENCOUNTER — Ambulatory Visit: Payer: BC Managed Care – PPO | Admitting: Nurse Practitioner

## 2022-04-13 NOTE — Telephone Encounter (Signed)
Pt was calling back after getting a  Mychart message that her appt today 7/28 was canceled.   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  Is it possible that she come in on 8/3  at 340 currently you are at you limit for that day?

## 2022-04-16 NOTE — Telephone Encounter (Signed)
Thank you. Will do!

## 2022-04-19 ENCOUNTER — Ambulatory Visit (INDEPENDENT_AMBULATORY_CARE_PROVIDER_SITE_OTHER): Payer: BC Managed Care – PPO | Admitting: Nurse Practitioner

## 2022-04-19 VITALS — BP 122/74 | HR 66 | Temp 98.4°F | Ht 63.3 in | Wt 143.0 lb

## 2022-04-19 DIAGNOSIS — E559 Vitamin D deficiency, unspecified: Secondary | ICD-10-CM | POA: Diagnosis not present

## 2022-04-19 DIAGNOSIS — E785 Hyperlipidemia, unspecified: Secondary | ICD-10-CM

## 2022-04-19 DIAGNOSIS — Z1231 Encounter for screening mammogram for malignant neoplasm of breast: Secondary | ICD-10-CM | POA: Diagnosis not present

## 2022-04-19 DIAGNOSIS — Z124 Encounter for screening for malignant neoplasm of cervix: Secondary | ICD-10-CM | POA: Diagnosis not present

## 2022-04-19 DIAGNOSIS — E039 Hypothyroidism, unspecified: Secondary | ICD-10-CM

## 2022-04-19 LAB — HEMOGLOBIN A1C: Hgb A1c MFr Bld: 5.4 % (ref 4.6–6.5)

## 2022-04-19 NOTE — Assessment & Plan Note (Signed)
Patient referred to OB/GYN for cervical cancer screening.

## 2022-04-19 NOTE — Assessment & Plan Note (Signed)
We will check serum vitamin D and calcium level, further recommendations may be made based upon his results.

## 2022-04-19 NOTE — Assessment & Plan Note (Signed)
We will check thyroid panel, further recommendations may be made based upon these results.

## 2022-04-19 NOTE — Assessment & Plan Note (Signed)
Bilateral screening mammogram ordered for breast cancer screening.  Further recommendations may be made based upon these results.

## 2022-04-19 NOTE — Progress Notes (Signed)
Established Patient Office Visit  Subjective   Patient ID: Jessica Klein, female    DOB: [birth year]  Age: 46 y.o. MRN: [id number]  Chief Complaint  Patient presents with   49mo follow up    No concerns   Patient arrives today for the above.  She continues to have hypothyroidism for which she takes levothyroxine every other day.  She continues on Prozac for depression and anxiety, she reports that her mood has been stable and she denies suicidal ideation.  She also has history of vitamin D deficiency and she continues on vitamin D3 supplement daily.  She has hyperlipidemia with last LDL around 150, current ASCVD risk score is approximately 0.6%.  Patient is also due for health maintenance including screening mammogram and screening Pap smear.    Review of Systems  Respiratory:  Negative for shortness of breath.   Cardiovascular:  Negative for chest pain.  Psychiatric/Behavioral:  Positive for depression. Negative for suicidal ideas. The patient does not have insomnia.       Objective:     BP 122/74 (BP Location: Right Arm, Patient Position: Sitting, Cuff Size: Large)   Pulse 66   Temp 98.4 F (36.9 C) (Oral)   Ht 5' 3.3" (1.608 m)   Wt 143 lb (64.9 kg)   SpO2 99%   BMI 25.09 kg/m  BP Readings from Last 3 Encounters:  04/19/22 122/74  10/05/21 120/82  11/28/20 106/76   Wt Readings from Last 3 Encounters:  04/19/22 143 lb (64.9 kg)  10/30/21 140 lb (63.5 kg)  10/05/21 140 lb (63.5 kg)      Physical Exam Vitals reviewed.  Constitutional:      General: She is not in acute distress.    Appearance: Normal appearance.  HENT:     Head: Normocephalic and atraumatic.  Neck:     Thyroid: No thyroid mass or thyromegaly.     Vascular: No carotid bruit.  Cardiovascular:     Rate and Rhythm: Normal rate and regular rhythm.     Pulses: Normal pulses.     Heart sounds: Normal heart sounds.  Pulmonary:     Effort: Pulmonary effort is normal.     Breath sounds: Normal  breath sounds.  Skin:    General: Skin is warm and dry.  Neurological:     General: No focal deficit present.     Mental Status: She is alert and oriented to person, place, and time.  Psychiatric:        Mood and Affect: Mood normal.        Behavior: Behavior normal.        Judgment: Judgment normal.         04/19/2022    3:24 PM 10/05/2021    8:08 AM 11/28/2020    2:07 PM  PHQ9 SCORE ONLY  PHQ-9 Total Score 3 0 0     No results found for any visits on 04/19/22.    The 10-year ASCVD risk score (Arnett DK, et al., 2019) is: 0.6%    Assessment & Plan:   Problem List Items Addressed This Visit       Endocrine   Hypothyroidism    We will check thyroid panel, further recommendations may be made based upon these results.      Relevant Orders   TSH   T3, free   T4, free   Hemoglobin A1c     Other   Cervical cancer screening - Primary    Patient referred to  OB/GYN for cervical cancer screening.      Relevant Orders   Ambulatory referral to Obstetrics / Gynecology   Basic metabolic panel   Encounter for screening mammogram for malignant neoplasm of breast    Bilateral screening mammogram ordered for breast cancer screening.  Further recommendations may be made based upon these results.      Relevant Orders   MM DIGITAL SCREENING BILATERAL   Vitamin D deficiency disease    We will check serum vitamin D and calcium level, further recommendations may be made based upon his results.      Relevant Orders   VITAMIN D 25 Hydroxy (Vit-D Deficiency, Fractures)   Hyperlipidemia    Recommended lifestyle modification by focusing on healthy diet and exercise.  We did discuss ASCVD risk score is quite low and I would not recommend starting statin therapy at this time.  Will recheck lipid panel due to patient's concerns, further recommendations may be made based upon these results.      Relevant Orders   Lipid panel   Hemoglobin A1c    Return in about 6 months (around  10/20/2022) for f/u with Jantz Main for CPE.    Elenore Paddy, NP

## 2022-04-19 NOTE — Assessment & Plan Note (Signed)
Recommended lifestyle modification by focusing on healthy diet and exercise.  We did discuss ASCVD risk score is quite low and I would not recommend starting statin therapy at this time.  Will recheck lipid panel due to patient's concerns, further recommendations may be made based upon these results.

## 2022-04-20 LAB — LIPID PANEL
Cholesterol: 218 mg/dL — ABNORMAL HIGH (ref 0–200)
HDL: 67.4 mg/dL (ref >39.00)
LDL Cholesterol: 139 mg/dL — ABNORMAL HIGH (ref 0–99)
NonHDL: 150.31
Total CHOL/HDL Ratio: 3
Triglycerides: 58 mg/dL (ref 0.0–149.0)
VLDL: 11.6 mg/dL (ref 0.0–40.0)

## 2022-04-20 LAB — T3, FREE: T3, Free: 2.7 pg/mL (ref 2.3–4.2)

## 2022-04-20 LAB — BASIC METABOLIC PANEL
BUN: 14 mg/dL (ref 6–23)
CO2: 26 mEq/L (ref 19–32)
Calcium: 9.1 mg/dL (ref 8.4–10.5)
Chloride: 104 mEq/L (ref 96–112)
Creatinine, Ser: 0.99 mg/dL (ref 0.40–1.20)
GFR: 69.42 mL/min (ref >60.00)
Glucose, Bld: 79 mg/dL (ref 70–99)
Potassium: 4.2 mEq/L (ref 3.5–5.1)
Sodium: 138 mEq/L (ref 135–145)

## 2022-04-20 LAB — TSH: TSH: 3.75 u[IU]/mL (ref 0.35–5.50)

## 2022-04-20 LAB — T4, FREE: Free T4: 0.88 ng/dL (ref 0.60–1.60)

## 2022-04-20 LAB — VITAMIN D 25 HYDROXY (VIT D DEFICIENCY, FRACTURES): VITD: 61.44 ng/mL (ref 30.00–100.00)

## 2022-06-10 ENCOUNTER — Other Ambulatory Visit: Payer: Self-pay

## 2022-06-10 ENCOUNTER — Encounter: Payer: Self-pay | Admitting: Emergency Medicine

## 2022-06-10 ENCOUNTER — Ambulatory Visit
Admission: EM | Admit: 2022-06-10 | Discharge: 2022-06-10 | Disposition: A | Discharge status: Home / Self Care | Payer: BC Managed Care – PPO | Attending: Physician Assistant | Admitting: Physician Assistant

## 2022-06-10 DIAGNOSIS — H1033 Unspecified acute conjunctivitis, bilateral: Secondary | ICD-10-CM

## 2022-06-10 MED ORDER — POLYMYXIN B-TRIMETHOPRIM 10000-0.1 UNIT/ML-% OP SOLN: 1.0000 [drp] | OPHTHALMIC | 0 refills | Status: AC

## 2022-06-10 NOTE — ED Triage Notes (Signed)
Right eye started watering last night, felt like a film present.  Dabbed eye with napkin.  Later, noticed redness.  Woke around 3:30 this morning and right eye matted, now both eyes are red.    Patient works at an [employer] school  Has not used any medications

## 2022-06-10 NOTE — ED Provider Notes (Signed)
[facility]    CSN: [id number] Arrival date & time: 06/10/22  1330      History   Chief Complaint Chief Complaint  Patient presents with   Eye Problem    HPI SHARECE FLEISCHHACKER is a 46 y.o. female.   Patient here today for evaluation of bilateral eye redness and crusting that she first noted last night. She denies any pain. She has not had any vision changes. She does work at an [employer].   The history is provided by the patient.    Past Medical History:  Diagnosis Date   Congenital deformity of spine    Depression    Enlarged thyroid 12/16/2014   Hypothyroidism    OCD (obsessive compulsive disorder)    Scoliosis     Patient Active Problem List   Diagnosis Date Noted   Cervical cancer screening 04/19/2022   Encounter for screening mammogram for malignant neoplasm of breast 04/19/2022   Vitamin D deficiency disease 04/19/2022   Hyperlipidemia 04/19/2022   Alopecia 08/10/2019   OCD (obsessive compulsive disorder)    Depression    Hypothyroidism    Major depressive disorder, single episode, unspecified 07/09/2018   Preventative health care 11/29/2017   Enlarged thyroid 12/16/2014    Past Surgical History:  Procedure Laterality Date   arm surgery Right    cyst removed   BACK SURGERY     rod, 3 screws for scoliosis   LEG SURGERY Right    birth mark removed   WISDOM TOOTH EXTRACTION      OB History     Gravida  0   Para  0   Term  0   Preterm  0   AB  0   Living  0      SAB  0   IAB  0   Ectopic  0   Multiple  0   Live Births               Home Medications    Prior to Admission medications   Medication Sig Start Date End Date Taking? Authorizing Provider  trimethoprim-polymyxin b (POLYTRIM) ophthalmic solution Place 1 drop into both eyes every 4 (four) hours for 7 days. 06/10/22 06/17/22 Yes Tomi Bamberger, PA-C  cholecalciferol (VITAMIN D3) 25 MCG (1000 UT) tablet Take 5,000 Units by mouth daily.     [provider]  FLUoxetine (PROZAC) 20 MG capsule Take 1 capsule (20 mg total) by mouth daily. 10/13/21   Elenore Paddy, NP  Levothyroxine Sodium 25 MCG CAPS Take 1 capsule (25 mcg total) by mouth every other day. 10/05/21   Elenore Paddy, NP  Multiple Vitamins-Minerals (MULTIVITAMIN WITH MINERALS) tablet Take 1 tablet by mouth daily.    [provider]    Family History Family History  Problem Relation Age of Onset   Thyroid disease Mother    Hypertension Mother    Other Mother        sleep apnea; degenerative disc   Breast cancer Mother 55   Glaucoma Mother    Cancer Father        melonoma   Hypertension Father    Thyroid disease Brother        hyperthyroidism   Heart attack Maternal Grandmother    Hypertension Maternal Grandmother    Other Maternal Grandmother        osteoporosis   Other Maternal Grandfather        aneursym   Stroke Maternal Grandfather  Cancer Paternal Grandmother        breast   Thyroid disease Paternal Grandmother    Other Paternal Grandmother        scolosis   Stroke Paternal Grandfather        x 2   Skin cancer Paternal Grandfather     Social History Social History   Tobacco Use   Smoking status: Never   Smokeless tobacco: Never  Vaping Use   Vaping Use: Never used  Substance Use Topics   Alcohol use: Yes    Comment: occasional   Drug use: No     Allergies   Ceclor [cefaclor]   Review of Systems Review of Systems  Constitutional:  Negative for chills and fever.  HENT:  Negative for congestion and rhinorrhea.   Eyes:  Positive for discharge and redness. Negative for photophobia, pain and visual disturbance.  Respiratory:  Negative for cough and shortness of breath.   Gastrointestinal:  Negative for nausea and vomiting.     Physical Exam Triage Vital Signs ED Triage Vitals  Enc Vitals Group     BP      Pulse      Resp      Temp      Temp src      SpO2      Weight      Height      Head Circumference       Peak Flow      Pain Score      Pain Loc      Pain Edu?      Excl. in GC?    No data found.  Updated Vital Signs BP 135/83 (BP Location: Right Arm)   Pulse 77   Temp 98 F (36.7 C) (Oral)   Resp 18   LMP 05/20/2022   SpO2 98%      Physical Exam Vitals and nursing note reviewed.  Constitutional:      General: She is not in acute distress.    Appearance: Normal appearance. She is not ill-appearing.  HENT:     Head: Normocephalic and atraumatic.  Eyes:     Extraocular Movements: Extraocular movements intact.     Pupils: Pupils are equal, round, and reactive to light.     Comments: Bilateral conjunctiva injected with small amount of purulent discharge noted from bilateral eyes  Cardiovascular:     Rate and Rhythm: Normal rate.  Pulmonary:     Effort: Pulmonary effort is normal.  Neurological:     Mental Status: She is alert.  Psychiatric:        Mood and Affect: Mood normal.        Behavior: Behavior normal.        Thought Content: Thought content normal.      UC Treatments / Results  Labs (all labs ordered are listed, but only abnormal results are displayed) Labs Reviewed - No data to display  EKG   Radiology No results found.  Procedures Procedures (including critical care time)  Medications Ordered in UC Medications - No data to display  Initial Impression / Assessment and Plan / UC Course  I have reviewed the triage vital signs and the nursing notes.  Pertinent labs & imaging results that were available during my care of the patient were reviewed by me and considered in my medical decision making (see chart for details).    Antibiotic drop prescribed for conjunctivitis treatment. Encouraged follow up with any further concerns.   Final  Clinical Impressions(s) / UC Diagnoses   Final diagnoses:  Acute conjunctivitis of both eyes, unspecified acute conjunctivitis type   Discharge Instructions   None    ED Prescriptions     Medication Sig  Dispense Auth. Provider   trimethoprim-polymyxin b (POLYTRIM) ophthalmic solution Place 1 drop into both eyes every 4 (four) hours for 7 days. 10 mL Francene Finders, PA-C      PDMP not reviewed this encounter.   Francene Finders, PA-C 06/10/22 1402

## 2022-06-15 ENCOUNTER — Other Ambulatory Visit: Payer: BC Managed Care – PPO | Admitting: Adult Health

## 2022-07-31 ENCOUNTER — Encounter: Payer: Self-pay | Admitting: Adult Health

## 2022-07-31 ENCOUNTER — Other Ambulatory Visit (HOSPITAL_COMMUNITY)
Admission: RE | Admit: 2022-07-31 | Discharge: 2022-07-31 | Disposition: A | Discharge status: Home / Self Care | Payer: BC Managed Care – PPO | Source: Ambulatory Visit | Attending: Adult Health | Admitting: Adult Health

## 2022-07-31 ENCOUNTER — Ambulatory Visit (INDEPENDENT_AMBULATORY_CARE_PROVIDER_SITE_OTHER): Payer: BC Managed Care – PPO | Admitting: Adult Health

## 2022-07-31 VITALS — BP 99/58 | HR 72 | Ht 64.0 in | Wt 144.0 lb

## 2022-07-31 DIAGNOSIS — Z124 Encounter for screening for malignant neoplasm of cervix: Secondary | ICD-10-CM

## 2022-07-31 DIAGNOSIS — Z1211 Encounter for screening for malignant neoplasm of colon: Secondary | ICD-10-CM

## 2022-07-31 DIAGNOSIS — Z01419 Encounter for gynecological examination (general) (routine) without abnormal findings: Secondary | ICD-10-CM | POA: Insufficient documentation

## 2022-07-31 LAB — HEMOCCULT GUIAC POC 1CARD (OFFICE): Fecal Occult Blood, POC: NEGATIVE

## 2022-07-31 NOTE — Progress Notes (Signed)
Patient ID: Jessica Klein, female   DOB: Aug 18, 1976, 46 y.o.   MRN: [id number] History of Present Illness: Amarionna is a 46 year old white female, single, G0P0, in for gyn exam and pap. She had physical with PCP 10/05/21.  PCP is Jiles Prows NP   Current Medications, Allergies, Past Medical History, Past Surgical History, Family History and Social History were reviewed in [employer] record.     Review of Systems: Periods may last 10 days, with first 4-5 days dark then last 4-5 days red and they are monthly Has never had sex    Physical Exam:BP (!) 99/58 (BP Location: Left Arm, Patient Position: Sitting, Cuff Size: Normal)   Pulse 72   Ht 5\' 4"  (1.626 m)   Wt 144 lb (65.3 kg)   LMP 07/06/2022 (Approximate)   BMI 24.72 kg/m   General:  Well developed, well nourished, no acute distress Skin:  Warm and dry Breast:  No dominant palpable mass, retraction, or nipple discharge Abdomen:  Soft, non tender, no hepatosplenomegaly Pelvic:  External genitalia is normal in appearance, no lesions.  The vagina is normal in appearance. Urethra has no lesions or masses. The cervix is smooth, pap with HR HPV genotyping performed.   Uterus is felt to be normal size, shape, and contour.  No adnexal masses or tenderness noted.Bladder is non tender, no masses felt. Rectal: Good sphincter tone, no polyps, or hemorrhoids felt.  Hemoccult negative. Extremities/musculoskeletal:  No swelling or varicosities noted, no clubbing or cyanosis Psych:  No mood changes, alert and cooperative,seems happy AA is 2 Fall risk is low    07/31/2022    3:51 PM 04/19/2022    3:24 PM 10/05/2021    8:08 AM  Depression screen PHQ 2/9  Decreased Interest 0 1 0  Down, Depressed, Hopeless 0 0 0  PHQ - 2 Score 0 1 0  Altered sleeping 0 1 0  Tired, decreased energy 1 1 0  Change in appetite 2 0 0  Feeling bad or failure about yourself  1 0 0  Trouble concentrating 0 0 0  Moving slowly or fidgety/restless 0  0 0  Suicidal thoughts 0 0 0  PHQ-9 Score 4 3 0  Difficult doing work/chores  Somewhat difficult Not difficult at all       07/31/2022    3:51 PM  GAD 7 : Generalized Anxiety Score  Nervous, Anxious, on Edge 1  Control/stop worrying 0  Worry too much - different things 1  Trouble relaxing 0  Restless 0  Easily annoyed or irritable 1  Afraid - awful might happen 1  Total GAD 7 Score 4      Upstream - 07/31/22 1550       Pregnancy Intention Screening   Does the patient want to become pregnant in the next year? N/A    Does the patient's partner want to become pregnant in the next year? N/A    Would the patient like to discuss contraceptive options today? N/A      Contraception Wrap Up   Current Method Abstinence    End Method Abstinence    Contraception Counseling Provided No             Examination chaperoned by Tish RN  Impression and Plan: 1. Encounter for gynecological examination with Papanicolaou smear of cervix Pap sent Pap in 3 years if normal Physical with PCP Labs with PCP Mammogram soon Colonoscopy or cologuard at 47   2. Encounter for  screening fecal occult blood testing Hemoccult was negative

## 2022-08-06 LAB — CYTOLOGY - PAP
Comment: NEGATIVE
Diagnosis: NEGATIVE
High risk HPV: NEGATIVE

## 2022-09-05 ENCOUNTER — Encounter: Payer: Self-pay | Admitting: Internal Medicine

## 2022-09-05 ENCOUNTER — Ambulatory Visit (INDEPENDENT_AMBULATORY_CARE_PROVIDER_SITE_OTHER): Payer: BC Managed Care – PPO | Admitting: Internal Medicine

## 2022-09-05 VITALS — BP 100/80 | HR 98 | Temp 98.1°F | Ht 64.0 in | Wt 143.0 lb

## 2022-09-05 DIAGNOSIS — J069 Acute upper respiratory infection, unspecified: Secondary | ICD-10-CM | POA: Diagnosis not present

## 2022-09-05 LAB — POCT INFLUENZA A/B
Influenza A, POC: NEGATIVE
Influenza B, POC: NEGATIVE

## 2022-09-05 LAB — POC COVID19 BINAXNOW: SARS Coronavirus 2 Ag: NEGATIVE

## 2022-09-05 MED ORDER — FLUTICASONE PROPIONATE 50 MCG/ACT NA SUSP: 2.0000 | Freq: Every day | NASAL | 6 refills | Status: DC

## 2022-09-05 NOTE — Patient Instructions (Addendum)
We have done the flu and covid-19 test today which are negative. We have sent in flonase to use 2 sprays at night time to help you get better faster.

## 2022-09-05 NOTE — Assessment & Plan Note (Signed)
Flu and covid-19 POC testing done at office both of which are negative. She is advised to stay home from work until 48 hours fever free without medication. Can use otc medication for symptoms. Rx flonase to help with drainage.

## 2022-09-05 NOTE — Progress Notes (Signed)
   Subjective:   Patient ID: Jessica Klein, female    DOB: [birth year], 46 y.o.   MRN: [id number]  HPI The patient is a 46 YO female coming in for URI symptoms last Friday started.  Review of Systems  Constitutional:  Positive for activity change, appetite change and chills. Negative for fatigue, fever and unexpected weight change.  HENT:  Positive for congestion, postnasal drip, rhinorrhea and sinus pressure. Negative for ear discharge, ear pain, sinus pain, sneezing, sore throat, tinnitus, trouble swallowing and voice change.   Eyes: Negative.   Respiratory:  Positive for cough. Negative for chest tightness, shortness of breath and wheezing.   Cardiovascular: Negative.   Gastrointestinal: Negative.   Musculoskeletal:  Positive for myalgias.  Neurological: Negative.     Objective:  Physical Exam Constitutional:      Appearance: She is well-developed.  HENT:     Head: Normocephalic and atraumatic.     Comments: Oropharynx with redness and clear drainage, nose with swollen turbinates, TMs normal bilaterally.  Neck:     Thyroid: No thyromegaly.  Cardiovascular:     Rate and Rhythm: Normal rate and regular rhythm.  Pulmonary:     Effort: Pulmonary effort is normal. No respiratory distress.     Breath sounds: Normal breath sounds. No wheezing or rales.  Abdominal:     Palpations: Abdomen is soft.  Musculoskeletal:        General: Tenderness present.     Cervical back: Normal range of motion.  Lymphadenopathy:     Cervical: No cervical adenopathy.  Skin:    General: Skin is warm and dry.  Neurological:     Mental Status: She is alert and oriented to person, place, and time.     Vitals:   09/05/22 0917  BP: 100/80  Pulse: 98  Temp: 98.1 F (36.7 C)  TempSrc: Oral  SpO2: 97%  Weight: 143 lb (64.9 kg)  Height: 5\' 4"  (1.626 m)    Assessment & Plan:

## 2022-10-11 ENCOUNTER — Ambulatory Visit (INDEPENDENT_AMBULATORY_CARE_PROVIDER_SITE_OTHER): Payer: BC Managed Care – PPO | Admitting: Nurse Practitioner

## 2022-10-11 VITALS — BP 114/72 | HR 69 | Temp 98.3°F | Ht 64.0 in | Wt 148.0 lb

## 2022-10-11 DIAGNOSIS — Z0001 Encounter for general adult medical examination with abnormal findings: Secondary | ICD-10-CM | POA: Insufficient documentation

## 2022-10-11 DIAGNOSIS — Z23 Encounter for immunization: Secondary | ICD-10-CM | POA: Insufficient documentation

## 2022-10-11 DIAGNOSIS — Z131 Encounter for screening for diabetes mellitus: Secondary | ICD-10-CM | POA: Insufficient documentation

## 2022-10-11 DIAGNOSIS — E039 Hypothyroidism, unspecified: Secondary | ICD-10-CM

## 2022-10-11 DIAGNOSIS — E785 Hyperlipidemia, unspecified: Secondary | ICD-10-CM | POA: Diagnosis not present

## 2022-10-11 NOTE — Progress Notes (Signed)
Established Patient Office Visit  Subjective   Patient ID: Jessica Klein, female    DOB: 1975/10/19  Age: 47 y.o. MRN: [id number]  Chief Complaint  Patient presents with   Annual Exam   Patient has for annual physical exam.  She is overdue for mammogram, up-to-date on Pap smear.  Due for flu shot.  Has completed STI and hep C screening.  Has history of hypothyroidism continues on levothyroxine.  Tolerating well.     Review of Systems  Constitutional:  Negative for fever, malaise/fatigue and weight loss.  Respiratory:  Negative for cough and shortness of breath.   Cardiovascular:  Positive for palpitations (intermittent sensation of skipping beats, chronic no known triggers, occurs for a few moments and then spontaneously resolves). Negative for chest pain.  Gastrointestinal:  Negative for abdominal pain and blood in stool.  Skin:        (+) hair loss - taking nutrafol  Neurological:  Negative for dizziness and headaches.  Psychiatric/Behavioral:  Negative for depression and suicidal ideas. The patient is not nervous/anxious and does not have insomnia.       Objective:     BP 114/72   Pulse 69   Temp 98.3 F (36.8 C) (Temporal)   Ht 5\' 4"  (1.626 m)   Wt 148 lb (67.1 kg)   BMI 25.40 kg/m  BP Readings from Last 3 Encounters:  10/11/22 114/72  09/05/22 100/80  07/31/22 (!) 99/58   Wt Readings from Last 3 Encounters:  10/11/22 148 lb (67.1 kg)  09/05/22 143 lb (64.9 kg)  07/31/22 144 lb (65.3 kg)        09/05/2022    9:23 AM 07/31/2022    3:51 PM 04/19/2022    3:24 PM  PHQ9 SCORE ONLY  PHQ-9 Total Score 0 4 3     Physical Exam Vitals reviewed. Exam conducted with a chaperone present.  Constitutional:      Appearance: Normal appearance.  HENT:     Head: Normocephalic and atraumatic.     Right Ear: Tympanic membrane, ear canal and external ear normal.     Left Ear: Tympanic membrane, ear canal and external ear normal.  Eyes:     General:        Right  eye: No discharge.        Left eye: No discharge.     Extraocular Movements: Extraocular movements intact.     Conjunctiva/sclera: Conjunctivae normal.     Pupils: Pupils are equal, round, and reactive to light.  Neck:     Vascular: No carotid bruit.  Cardiovascular:     Rate and Rhythm: Normal rate and regular rhythm.     Pulses: Normal pulses.     Heart sounds: Normal heart sounds. No murmur heard. Pulmonary:     Effort: Pulmonary effort is normal.     Breath sounds: Normal breath sounds.  Chest:  Breasts:    Breasts are symmetrical.     Right: Normal.     Left: Normal.  Abdominal:     General: Abdomen is flat. Bowel sounds are normal. There is no distension.     Palpations: Abdomen is soft. There is no mass.     Tenderness: There is no abdominal tenderness.  Musculoskeletal:        General: No tenderness.     Cervical back: Neck supple. No muscular tenderness.     Right lower leg: No edema.     Left lower leg: No edema.  Lymphadenopathy:  Cervical: No cervical adenopathy.     Upper Body:     Right upper body: No supraclavicular adenopathy.     Left upper body: No supraclavicular adenopathy.  Skin:    General: Skin is warm and dry.  Neurological:     General: No focal deficit present.     Mental Status: She is alert and oriented to person, place, and time.     Motor: No weakness.     Gait: Gait normal.  Psychiatric:        Mood and Affect: Mood normal.        Behavior: Behavior normal.        Judgment: Judgment normal.      No results found for any visits on 10/11/22.    The 10-year ASCVD risk score (Arnett DK, et al., 2019) is: 0.5%    Assessment & Plan:   Problem List Items Addressed This Visit       Endocrine   Hypothyroidism    Chronic, check TSH.  Further recommendations may be made based upon these results.      Relevant Orders   TSH   Hemoglobin A1c   Comprehensive metabolic panel   CBC   Lipid panel     Other   Hyperlipidemia     Labs ordered for further evaluation, further recommendations may be made based upon these results.      Relevant Orders   TSH   Hemoglobin A1c   Comprehensive metabolic panel   CBC   Lipid panel   Encounter for general adult medical examination with abnormal findings - Primary    Labs ordered for further evaluation, further recommendations may be made based upon these results.  Patient provided handout regarding preventative health care recommendations.  Flu shot administered today.  Encouraged patient to schedule mammogram, she reports understanding and plans to do so.      Relevant Orders   TSH   Hemoglobin A1c   Comprehensive metabolic panel   CBC   Lipid panel   Diabetes mellitus screening    Labs ordered for further evaluation, further recommendations may be made based upon these results.      Relevant Orders   TSH   Hemoglobin A1c   Comprehensive metabolic panel   CBC   Lipid panel   Need for vaccination    Flu shot administered, VIS provided.      Relevant Orders   Flu Vaccine QUAD 6+ mos PF IM (Fluarix Quad PF) (Completed)    Return in about 6 months (around 04/11/2023) for f/u with Judson Roch.    Ailene Ards, NP

## 2022-10-11 NOTE — Assessment & Plan Note (Signed)
Labs ordered for further evaluation, further recommendations may be made based upon these results.  Patient provided handout regarding preventative health care recommendations.  Flu shot administered today.  Encouraged patient to schedule mammogram, she reports understanding and plans to do so.

## 2022-10-11 NOTE — Assessment & Plan Note (Signed)
Flu shot administered, VIS provided. 

## 2022-10-11 NOTE — Assessment & Plan Note (Signed)
Labs ordered for further evaluation, further recommendations may be made based upon these results.

## 2022-10-11 NOTE — Assessment & Plan Note (Signed)
Chronic, check TSH.  Further recommendations may be made based upon these results.

## 2022-10-12 LAB — CBC
HCT: 37.6 % (ref 36.0–46.0)
Hemoglobin: 12.7 g/dL (ref 12.0–15.0)
MCHC: 33.9 g/dL (ref 30.0–36.0)
MCV: 88.1 fl (ref 78.0–100.0)
Platelets: 404 10*3/uL — ABNORMAL HIGH (ref 150.0–400.0)
RBC: 4.27 Mil/uL (ref 3.87–5.11)
RDW: 12.9 % (ref 11.5–15.5)
WBC: 8.8 10*3/uL (ref 4.0–10.5)

## 2022-10-12 LAB — LIPID PANEL
Cholesterol: 248 mg/dL — ABNORMAL HIGH (ref 0–200)
HDL: 75.4 mg/dL (ref >39.00)
LDL Cholesterol: 156 mg/dL — ABNORMAL HIGH (ref 0–99)
NonHDL: 173.07
Total CHOL/HDL Ratio: 3
Triglycerides: 83 mg/dL (ref 0.0–149.0)
VLDL: 16.6 mg/dL (ref 0.0–40.0)

## 2022-10-12 LAB — COMPREHENSIVE METABOLIC PANEL
ALT: 19 U/L (ref 0–35)
AST: 24 U/L (ref 0–37)
Albumin: 4.2 g/dL (ref 3.5–5.2)
Alkaline Phosphatase: 53 U/L (ref 39–117)
BUN: 8 mg/dL (ref 6–23)
CO2: 29 mEq/L (ref 19–32)
Calcium: 8.6 mg/dL (ref 8.4–10.5)
Chloride: 99 mEq/L (ref 96–112)
Creatinine, Ser: 0.72 mg/dL (ref 0.40–1.20)
GFR: 101.38 mL/min (ref >60.00)
Glucose, Bld: 80 mg/dL (ref 70–99)
Potassium: 4.3 mEq/L (ref 3.5–5.1)
Sodium: 135 mEq/L (ref 135–145)
Total Bilirubin: 0.4 mg/dL (ref 0.2–1.2)
Total Protein: 6.8 g/dL (ref 6.0–8.3)

## 2022-10-12 LAB — TSH: TSH: 3.39 u[IU]/mL (ref 0.35–5.50)

## 2022-10-12 LAB — HEMOGLOBIN A1C: Hgb A1c MFr Bld: 5.4 % (ref 4.6–6.5)

## 2022-11-29 ENCOUNTER — Other Ambulatory Visit: Payer: Self-pay | Admitting: Nurse Practitioner

## 2022-11-29 DIAGNOSIS — Z1211 Encounter for screening for malignant neoplasm of colon: Secondary | ICD-10-CM

## 2022-12-03 ENCOUNTER — Encounter (INDEPENDENT_AMBULATORY_CARE_PROVIDER_SITE_OTHER): Payer: Self-pay | Admitting: *Deleted

## 2023-01-01 ENCOUNTER — Other Ambulatory Visit: Payer: Self-pay | Admitting: Nurse Practitioner

## 2023-01-01 DIAGNOSIS — F419 Anxiety disorder, unspecified: Secondary | ICD-10-CM

## 2023-01-01 DIAGNOSIS — E039 Hypothyroidism, unspecified: Secondary | ICD-10-CM

## 2023-01-21 ENCOUNTER — Encounter: Payer: Self-pay | Admitting: Nurse Practitioner

## 2023-04-12 ENCOUNTER — Ambulatory Visit: Payer: BC Managed Care – PPO | Admitting: Nurse Practitioner

## 2023-04-19 ENCOUNTER — Ambulatory Visit: Payer: BC Managed Care – PPO | Admitting: Nurse Practitioner

## 2023-04-19 ENCOUNTER — Telehealth: Payer: Self-pay | Admitting: Nurse Practitioner

## 2023-04-19 ENCOUNTER — Other Ambulatory Visit: Payer: Self-pay | Admitting: Nurse Practitioner

## 2023-04-19 VITALS — BP 118/80 | HR 66 | Temp 98.1°F | Ht 64.0 in | Wt 149.0 lb

## 2023-04-19 DIAGNOSIS — F419 Anxiety disorder, unspecified: Secondary | ICD-10-CM

## 2023-04-19 DIAGNOSIS — E039 Hypothyroidism, unspecified: Secondary | ICD-10-CM

## 2023-04-19 DIAGNOSIS — K120 Recurrent oral aphthae: Secondary | ICD-10-CM | POA: Diagnosis not present

## 2023-04-19 DIAGNOSIS — Z1231 Encounter for screening mammogram for malignant neoplasm of breast: Secondary | ICD-10-CM | POA: Diagnosis not present

## 2023-04-19 LAB — TSH: TSH: 4.48 u[IU]/mL (ref 0.35–5.50)

## 2023-04-19 MED ORDER — FLUOXETINE HCL 20 MG PO CAPS: 20.0000 mg | ORAL_CAPSULE | Freq: Every day | ORAL | 3 refills | Status: DC

## 2023-04-19 MED ORDER — LEVOTHYROXINE SODIUM 25 MCG PO TABS: 25.0000 ug | ORAL_TABLET | ORAL | 3 refills | Status: DC

## 2023-04-19 NOTE — Progress Notes (Signed)
Established Patient Office Visit  Subjective   Patient ID: Jessica Klein, female    DOB: 02-20-1976  Age: 47 y.o. MRN: [id number]  Chief Complaint  Patient presents with   Hypothyroidism    Hypothyroidism: Normally takes levothyroxine 25 mcg every other day.  Has been out of this medication for a week or so.  Does feel fatigued.  Otherwise feels well. Depression: Continues on Prozac 20 mg daily, tolerating well.  Denies suicidal ideation Breast cancer screening, due for screening mammogram Throat lesion: Has been present for slightly less than 1 week.  Has had some mild sore throat with swallowing.  No fever    Review of Systems  Constitutional:  Positive for malaise/fatigue. Negative for fever and weight loss.  Respiratory:  Negative for cough and shortness of breath.   Cardiovascular:  Negative for chest pain.      Objective:     BP 118/80   Pulse 66   Temp 98.1 F (36.7 C) (Oral)   Ht 5\' 4"  (1.626 m)   Wt 149 lb (67.6 kg)   SpO2 99%   BMI 25.58 kg/m  BP Readings from Last 3 Encounters:  04/19/23 118/80  10/11/22 114/72  09/05/22 100/80   Wt Readings from Last 3 Encounters:  04/19/23 149 lb (67.6 kg)  10/11/22 148 lb (67.1 kg)  09/05/22 143 lb (64.9 kg)        04/19/2023   10:57 AM 09/05/2022    9:23 AM 07/31/2022    3:51 PM  PHQ9 SCORE ONLY  PHQ-9 Total Score 0 0 4      07/31/2022    3:51 PM  GAD 7 : Generalized Anxiety Score  Nervous, Anxious, on Edge 1  Control/stop worrying 0  Worry too much - different things 1  Trouble relaxing 0  Restless 0  Easily annoyed or irritable 1  Afraid - awful might happen 1  Total GAD 7 Score 4      Physical Exam Vitals reviewed.  Constitutional:      General: She is not in acute distress.    Appearance: Normal appearance.  HENT:     Head: Normocephalic and atraumatic.     Mouth/Throat:   Neck:     Vascular: No carotid bruit.  Cardiovascular:     Rate and Rhythm: Normal rate and regular rhythm.      Pulses: Normal pulses.     Heart sounds: Normal heart sounds.  Pulmonary:     Effort: Pulmonary effort is normal.     Breath sounds: Normal breath sounds.  Skin:    General: Skin is warm and dry.  Neurological:     General: No focal deficit present.     Mental Status: She is alert and oriented to person, place, and time.  Psychiatric:        Mood and Affect: Mood normal.        Behavior: Behavior normal.        Judgment: Judgment normal.      No results found for any visits on 04/19/23.    The 10-year ASCVD risk score (Arnett DK, et al., 2019) is: 0.6%    Assessment & Plan:   Problem List Items Addressed This Visit       Digestive   Canker sore    Lesion throat appears consistent with canker sore Treat with warm water and salt gargles, Cepacol lozenges for pain management.  Patient told if symptoms do not resolve within 7-10 days of onset to  call office at which point may do culture of the area.        Endocrine   Hypothyroidism    Chronic Check TSH Prescribed levothyroxine pending these results      Relevant Orders   TSH     Other   Encounter for screening mammogram for malignant neoplasm of breast - Primary    Reorder screening mammogram today      Relevant Orders   MM 3D DIAGNOSTIC MAMMOGRAM BILATERAL BREAST   Anxiety    Chronic Stable on Prozac 20 mg daily.  Refill sent to pharmacy.      Relevant Medications   FLUoxetine (PROZAC) 20 MG capsule    Return in about 6 months (around 10/20/2023) for CPE with Maralyn Sago.    Elenore Paddy, NP

## 2023-04-19 NOTE — Assessment & Plan Note (Signed)
Reorder screening mammogram today

## 2023-04-19 NOTE — Assessment & Plan Note (Signed)
Chronic Check TSH Prescribed levothyroxine pending these results

## 2023-04-19 NOTE — Assessment & Plan Note (Signed)
Chronic Stable on Prozac 20 mg daily.  Refill sent to pharmacy.

## 2023-04-19 NOTE — Telephone Encounter (Signed)
Please call patient and let her know that her screening mammogram order is about to expire. I recommend she go through with screening mammogram and if she would like to do so let me know so that I can reorder the imaging test.

## 2023-04-19 NOTE — Telephone Encounter (Signed)
Pt is coming in today will discuss at the appt...lmb

## 2023-04-19 NOTE — Assessment & Plan Note (Signed)
Lesion throat appears consistent with canker sore Treat with warm water and salt gargles, Cepacol lozenges for pain management.  Patient told if symptoms do not resolve within 7-10 days of onset to call office at which point may do culture of the area.

## 2023-04-29 ENCOUNTER — Ambulatory Visit: Payer: BC Managed Care – PPO | Admitting: Physician Assistant

## 2023-04-29 ENCOUNTER — Encounter: Payer: Self-pay | Admitting: Physician Assistant

## 2023-04-29 VITALS — BP 112/68 | HR 89 | Temp 97.8°F | Resp 18 | Ht 64.0 in | Wt 159.0 lb

## 2023-04-29 DIAGNOSIS — L237 Allergic contact dermatitis due to plants, except food: Secondary | ICD-10-CM

## 2023-04-29 MED ORDER — HYDROXYZINE HCL 10 MG PO TABS: 10.0000 mg | ORAL_TABLET | Freq: Three times a day (TID) | ORAL | 0 refills | Status: DC | PRN

## 2023-04-29 MED ORDER — METHYLPREDNISOLONE ACETATE 80 MG/ML IJ SUSP
80.0000 mg | Freq: Once | INTRAMUSCULAR | Status: DC
Start: 2023-04-29 — End: 2023-04-29

## 2023-04-29 MED ORDER — METHYLPREDNISOLONE ACETATE 80 MG/ML IJ SUSP
80.0000 mg | Freq: Once | INTRAMUSCULAR | Status: AC
Start: 2023-04-29 — End: 2023-04-29
  Administered 2023-04-29: 80 mg via INTRAMUSCULAR

## 2023-04-29 MED ORDER — PREDNISONE 20 MG PO TABS
20.0000 mg | ORAL_TABLET | Freq: Every day | ORAL | 0 refills | Status: AC
Start: 2023-04-30 — End: 2023-05-05

## 2023-04-29 NOTE — Progress Notes (Signed)
Subjective:    Patient ID: Jessica Klein, female    DOB: [birth year], 47 y.o.   MRN: [id number]  Chief Complaint  Patient presents with   Poison Ivy    Pt states she has poison oak on her neck , on both wrist Draining itches     HPI Patient is in today for poison oak dermatitis x 2-3 days.  Got into poison oak Thursday evening Poison oak started on left hand and forearm  Now spreading on both hands and arms, neck, one lesion near left eye No CP or SOB. No fever or chills. Just itching and weeping lesions.    Past Medical History:  Diagnosis Date   Congenital deformity of spine    Depression    Enlarged thyroid 12/16/2014   Hypothyroidism    OCD (obsessive compulsive disorder)    Scoliosis     Past Surgical History:  Procedure Laterality Date   arm surgery Right    cyst removed   BACK SURGERY     rod, 3 screws for scoliosis   LEG SURGERY Right    birth mark removed   WISDOM TOOTH EXTRACTION      Family History  Problem Relation Age of Onset   Stroke Paternal Grandfather        x 2   Skin cancer Paternal Grandfather    Cancer Paternal Grandmother        breast   Thyroid disease Paternal Grandmother    Other Paternal Grandmother        scolosis   Heart attack Maternal Grandmother    Hypertension Maternal Grandmother    Other Maternal Grandmother        osteoporosis   Other Maternal Grandfather        aneursym   Stroke Maternal Grandfather    Cancer Father        melonoma   Hypertension Father    Thyroid disease Mother    Hypertension Mother    Other Mother        sleep apnea; degenerative disc   Breast cancer Mother 53   Glaucoma Mother    Thyroid disease Brother        hyperthyroidism    Social History   Tobacco Use   Smoking status: Never   Smokeless tobacco: Never  Vaping Use   Vaping status: Never Used  Substance Use Topics   Alcohol use: Yes    Comment: occasional   Drug use: No     Allergies  Allergen Reactions   Ceclor  [Cefaclor]     Review of Systems NEGATIVE UNLESS OTHERWISE INDICATED IN HPI      Objective:     BP 112/68   Pulse 89   Temp 97.8 F (36.6 C) (Temporal)   Resp 18   Ht 5\' 4"  (1.626 m)   Wt 159 lb (72.1 kg)   SpO2 100%   BMI 27.29 kg/m   Wt Readings from Last 3 Encounters:  04/29/23 159 lb (72.1 kg)  04/19/23 149 lb (67.6 kg)  10/11/22 148 lb (67.1 kg)    BP Readings from Last 3 Encounters:  04/29/23 112/68  04/19/23 118/80  10/11/22 114/72     Physical Exam Vitals and nursing note reviewed.  Constitutional:      Appearance: Normal appearance.  Eyes:     Extraocular Movements: Extraocular movements intact.     Conjunctiva/sclera: Conjunctivae normal.     Pupils: Pupils are equal, round, and reactive to light.  Cardiovascular:  Rate and Rhythm: Normal rate and regular rhythm.  Pulmonary:     Effort: Pulmonary effort is normal.  Skin:    Findings: Rash present.     Comments: Patch of erythema with weeping vesicles left forearm and hand; scattered maculopapular lesions hands, arms, posterior neck and left eye   Neurological:     General: No focal deficit present.     Mental Status: She is alert and oriented to person, place, and time.  Psychiatric:        Mood and Affect: Mood normal.        Assessment & Plan:  Poison oak dermatitis -     hydrOXYzine HCl; Take 1 tablet (10 mg total) by mouth 3 (three) times daily as needed for itching. Caution drowsiness.  Dispense: 30 tablet; Refill: 0 -     predniSONE; Take 1 tablet (20 mg total) by mouth daily with breakfast for 5 days.  Dispense: 5 tablet; Refill: 0 -     methylPREDNISolone Acetate   Keep weeping areas covered - redressed area in office today Pt aware of risks vs benefits and possible adverse reactions of medications AVS provided     Return if symptoms worsen or fail to improve.    Khyli Swaim M Ariannie Penaloza, PA-C

## 2023-05-23 ENCOUNTER — Encounter (INDEPENDENT_AMBULATORY_CARE_PROVIDER_SITE_OTHER): Payer: Self-pay | Admitting: *Deleted

## 2023-09-24 ENCOUNTER — Other Ambulatory Visit (HOSPITAL_COMMUNITY): Payer: Self-pay | Admitting: Nurse Practitioner

## 2023-09-24 DIAGNOSIS — Z1231 Encounter for screening mammogram for malignant neoplasm of breast: Secondary | ICD-10-CM

## 2023-10-09 ENCOUNTER — Ambulatory Visit (HOSPITAL_COMMUNITY): Payer: Self-pay

## 2023-10-11 ENCOUNTER — Ambulatory Visit (HOSPITAL_COMMUNITY)
Admission: RE | Admit: 2023-10-11 | Discharge: 2023-10-11 | Disposition: A | Discharge status: Home / Self Care | Payer: 59 | Source: Ambulatory Visit | Attending: Nurse Practitioner | Admitting: Nurse Practitioner

## 2023-10-11 DIAGNOSIS — Z1231 Encounter for screening mammogram for malignant neoplasm of breast: Secondary | ICD-10-CM | POA: Diagnosis present

## 2023-10-15 ENCOUNTER — Encounter (HOSPITAL_COMMUNITY): Payer: Self-pay | Admitting: Nurse Practitioner

## 2023-10-15 ENCOUNTER — Other Ambulatory Visit (HOSPITAL_COMMUNITY): Payer: Self-pay | Admitting: Nurse Practitioner

## 2023-10-15 ENCOUNTER — Encounter: Payer: Self-pay | Admitting: Nurse Practitioner

## 2023-10-15 DIAGNOSIS — R928 Other abnormal and inconclusive findings on diagnostic imaging of breast: Secondary | ICD-10-CM

## 2023-10-18 ENCOUNTER — Other Ambulatory Visit: Payer: Self-pay | Admitting: Nurse Practitioner

## 2023-10-18 DIAGNOSIS — R928 Other abnormal and inconclusive findings on diagnostic imaging of breast: Secondary | ICD-10-CM

## 2023-10-22 ENCOUNTER — Encounter (HOSPITAL_COMMUNITY): Payer: Self-pay

## 2023-10-22 ENCOUNTER — Ambulatory Visit (HOSPITAL_COMMUNITY)
Admission: RE | Admit: 2023-10-22 | Discharge: 2023-10-22 | Disposition: A | Discharge status: Home / Self Care | Payer: 59 | Source: Ambulatory Visit | Attending: Nurse Practitioner | Admitting: Nurse Practitioner

## 2023-10-22 DIAGNOSIS — R928 Other abnormal and inconclusive findings on diagnostic imaging of breast: Secondary | ICD-10-CM | POA: Diagnosis present

## 2023-10-23 ENCOUNTER — Encounter: Payer: Self-pay | Admitting: Nurse Practitioner

## 2023-10-24 ENCOUNTER — Ambulatory Visit: Payer: 59 | Admitting: Nurse Practitioner

## 2023-10-24 ENCOUNTER — Encounter: Payer: Self-pay | Admitting: Nurse Practitioner

## 2023-10-24 VITALS — BP 110/72 | HR 71 | Temp 97.8°F | Ht 64.0 in | Wt 148.2 lb

## 2023-10-24 DIAGNOSIS — E785 Hyperlipidemia, unspecified: Secondary | ICD-10-CM | POA: Diagnosis not present

## 2023-10-24 DIAGNOSIS — Z0001 Encounter for general adult medical examination with abnormal findings: Secondary | ICD-10-CM

## 2023-10-24 DIAGNOSIS — Z131 Encounter for screening for diabetes mellitus: Secondary | ICD-10-CM

## 2023-10-24 DIAGNOSIS — L659 Nonscarring hair loss, unspecified: Secondary | ICD-10-CM

## 2023-10-24 DIAGNOSIS — E559 Vitamin D deficiency, unspecified: Secondary | ICD-10-CM

## 2023-10-24 DIAGNOSIS — E039 Hypothyroidism, unspecified: Secondary | ICD-10-CM | POA: Diagnosis not present

## 2023-10-24 LAB — COMPREHENSIVE METABOLIC PANEL
ALT: 10 U/L (ref 0–35)
AST: 15 U/L (ref 0–37)
Albumin: 4.3 g/dL (ref 3.5–5.2)
Alkaline Phosphatase: 50 U/L (ref 39–117)
BUN: 11 mg/dL (ref 6–23)
CO2: 26 meq/L (ref 19–32)
Calcium: 8.9 mg/dL (ref 8.4–10.5)
Chloride: 104 meq/L (ref 96–112)
Creatinine, Ser: 0.8 mg/dL (ref 0.40–1.20)
GFR: 88.7 mL/min (ref >60.00)
Glucose, Bld: 89 mg/dL (ref 70–99)
Potassium: 4.6 meq/L (ref 3.5–5.1)
Sodium: 136 meq/L (ref 135–145)
Total Bilirubin: 0.6 mg/dL (ref 0.2–1.2)
Total Protein: 6.8 g/dL (ref 6.0–8.3)

## 2023-10-24 LAB — LIPID PANEL
Cholesterol: 228 mg/dL — ABNORMAL HIGH (ref 0–200)
HDL: 70 mg/dL (ref >39.00)
LDL Cholesterol: 147 mg/dL — ABNORMAL HIGH (ref 0–99)
NonHDL: 157.68
Total CHOL/HDL Ratio: 3
Triglycerides: 51 mg/dL (ref 0.0–149.0)
VLDL: 10.2 mg/dL (ref 0.0–40.0)

## 2023-10-24 LAB — CBC
HCT: 39.2 % (ref 36.0–46.0)
Hemoglobin: 13 g/dL (ref 12.0–15.0)
MCHC: 33.2 g/dL (ref 30.0–36.0)
MCV: 88.1 fL (ref 78.0–100.0)
Platelets: 374 10*3/uL (ref 150.0–400.0)
RBC: 4.45 Mil/uL (ref 3.87–5.11)
RDW: 13.3 % (ref 11.5–15.5)
WBC: 6.3 10*3/uL (ref 4.0–10.5)

## 2023-10-24 LAB — IRON: Iron: 105 ug/dL (ref 42–145)

## 2023-10-24 LAB — T4, FREE: Free T4: 0.88 ng/dL (ref 0.60–1.60)

## 2023-10-24 LAB — FERRITIN: Ferritin: 11.1 ng/mL (ref 10.0–291.0)

## 2023-10-24 LAB — TSH: TSH: 3.5 u[IU]/mL (ref 0.35–5.50)

## 2023-10-24 LAB — VITAMIN B12: Vitamin B-12: 204 pg/mL — ABNORMAL LOW (ref 211–911)

## 2023-10-24 LAB — VITAMIN D 25 HYDROXY (VIT D DEFICIENCY, FRACTURES): VITD: 42.3 ng/mL (ref 30.00–100.00)

## 2023-10-24 LAB — HEMOGLOBIN A1C: Hgb A1c MFr Bld: 5.3 % (ref 4.6–6.5)

## 2023-10-24 LAB — T3, FREE: T3, Free: 3 pg/mL (ref 2.3–4.2)

## 2023-10-24 NOTE — Assessment & Plan Note (Signed)
 Labs ordered, further recommendations may be made based upon these results

## 2023-10-24 NOTE — Assessment & Plan Note (Signed)
 Chronic Continue levothyroxine  25 mcg daily Labs ordered, further recommendations may be made based upon these results

## 2023-10-24 NOTE — Assessment & Plan Note (Signed)
 Healthy lifestyle recommendations discussed, screening recommendations discussed.  Patient due for colon cancer screening, she has been referred previously and feels comfortable calling GI provider on her own to schedule consultation regarding this.  She recently had screening mammogram that identified abnormality, follow-up diagnostic mammogram identified benign cysts.  It was recommended by radiologist for patient to be considered for possible MRI breast cancer screening annually moving forward.  Will consider this imaging modality when breast cancer screening due again next year.

## 2023-10-24 NOTE — Progress Notes (Signed)
 Complete physical exam  Patient: Jessica Klein   DOB: 03/28/1976   47 y.o. Female  MRN: [id number]  Subjective:    Chief Complaint  Patient presents with   Annual Exam    Jessica Klein is a 48 y.o. female who presents today for a complete physical exam. She reports consuming a general diet.  Exercise: working on increasing activity level.  She generally feels well. She reports sleeping well. She does have additional problems to discuss today.   Patient report recently has been experiencing worsening hair loss.  Does have hypothyroidism at baseline.  Due for thyroid  panel check.  Reports that this occurred a couple months ago and she feels the hair loss is actually improved between then and now but want to discuss this.  Most recent fall risk assessment:    04/19/2023   10:57 AM  Fall Risk   Falls in the past year? 0  Number falls in past yr: 0  Injury with Fall? 0  Risk for fall due to : No Fall Risks  Follow up Falls evaluation completed     Most recent depression screenings:    04/19/2023   10:57 AM 09/05/2022    9:23 AM  PHQ 2/9 Scores  PHQ - 2 Score 0 0  PHQ- 9 Score  0    Vision:Not within last year  and Dental: No current dental problems and Receives regular dental care  Patient Active Problem List   Diagnosis Date Noted   Hair loss 10/24/2023   Canker sore 04/19/2023   Anxiety 04/19/2023   Encounter for general adult medical examination with abnormal findings 10/11/2022   Diabetes mellitus screening 10/11/2022   Need for vaccination 10/11/2022   Acute URI 09/05/2022   Cervical cancer screening 04/19/2022   Encounter for screening mammogram for malignant neoplasm of breast 04/19/2022   Vitamin D  deficiency disease 04/19/2022   Hyperlipidemia 04/19/2022   Alopecia 08/10/2019   OCD (obsessive compulsive disorder)    Depression    Hypothyroidism    Major depressive disorder, single episode, unspecified 07/09/2018   Preventative health care 11/29/2017    Enlarged thyroid  12/16/2014   Past Medical History:  Diagnosis Date   Congenital deformity of spine    Depression    Enlarged thyroid  12/16/2014   Hypothyroidism    OCD (obsessive compulsive disorder)    Scoliosis    Past Surgical History:  Procedure Laterality Date   arm surgery Right    cyst removed   BACK SURGERY     rod, 3 screws for scoliosis   LEG SURGERY Right    birth mark removed   WISDOM TOOTH EXTRACTION     Social History   Socioeconomic History   Marital status: Single    Spouse name: Not on file   Number of children: Not on file   Years of education: Not on file   Highest education level: Not on file  Occupational History   Not on file  Tobacco Use   Smoking status: Never   Smokeless tobacco: Never  Vaping Use   Vaping status: Never Used  Substance and Sexual Activity   Alcohol use: Yes    Comment: occasional   Drug use: No   Sexual activity: Never    Birth control/protection: None  Other Topics Concern   Not on file  Social History Narrative   Not on file   Social Drivers of Health   Financial Resource Strain: Low Risk  (07/31/2022)   Overall Financial  Resource Strain (CARDIA)    Difficulty of Paying Living Expenses: Not very hard  Food Insecurity: No Food Insecurity (07/31/2022)   Hunger Vital Sign    Worried About Running Out of Food in the Last Year: Never true    Ran Out of Food in the Last Year: Never true  Transportation Needs: No Transportation Needs (07/31/2022)   PRAPARE - Administrator, Civil Service (Medical): No    Lack of Transportation (Non-Medical): No  Physical Activity: Insufficiently Active (07/31/2022)   Exercise Vital Sign    Days of Exercise per Week: 2 days    Minutes of Exercise per Session: 40 min  Stress: No Stress Concern Present (07/31/2022)   [employer] of Occupational Health - Occupational Stress Questionnaire    Feeling of Stress : Only a little  Social Connections: Socially Isolated  (07/31/2022)   Social Connection and Isolation Panel [NHANES]    Frequency of Communication with Friends and Family: Once a week    Frequency of Social Gatherings with Friends and Family: Once a week    Attends Religious Services: More than 4 times per year    Active Member of [employer] or Organizations: No    Attends Banker Meetings: Never    Marital Status: Never married  Intimate Partner Violence: Not At Risk (07/31/2022)   Humiliation, Afraid, Rape, and Kick questionnaire    Fear of Current or Ex-Partner: No    Emotionally Abused: No    Physically Abused: No    Sexually Abused: No   Family History  Problem Relation Age of Onset   Stroke Paternal Grandfather        x 2   Skin cancer Paternal Grandfather    Cancer Paternal Grandmother        breast   Thyroid  disease Paternal Grandmother    Other Paternal Grandmother        scolosis   Heart attack Maternal Grandmother    Hypertension Maternal Grandmother    Other Maternal Grandmother        osteoporosis   Other Maternal Grandfather        aneursym   Stroke Maternal Grandfather    Cancer Father        melonoma   Hypertension Father    Thyroid  disease Mother    Hypertension Mother    Other Mother        sleep apnea; degenerative disc   Breast cancer Mother 37   Glaucoma Mother    Thyroid  disease Brother        hyperthyroidism   Allergies  Allergen Reactions   Ceclor [Cefaclor]       Patient Care Team: Elnor Lauraine BRAVO, NP as PCP - General (Nurse Practitioner)   Outpatient Medications Prior to Visit  Medication Sig   FLUoxetine  (PROZAC ) 20 MG capsule Take 1 capsule (20 mg total) by mouth daily.   fluticasone  (FLONASE ) 50 MCG/ACT nasal spray Place 2 sprays into both nostrils daily.   hydrOXYzine  (ATARAX ) 10 MG tablet Take 1 tablet (10 mg total) by mouth 3 (three) times daily as needed for itching. Caution drowsiness.   levothyroxine  (SYNTHROID ) 25 MCG tablet Take 1 tablet (25 mcg total) by mouth every  other day.   NON FORMULARY 4 tablets daily. Nutrafol (4 tabs/day) also takes a pre/probiotic   No facility-administered medications prior to visit.    Review of Systems  Constitutional:  Negative for chills and fever.  HENT:  Negative for hearing loss and tinnitus.  Eyes:  Negative for blurred vision and double vision.  Respiratory:  Negative for cough and wheezing.   Cardiovascular:  Positive for palpitations. Negative for chest pain.  Gastrointestinal:  Negative for abdominal pain and blood in stool.  Genitourinary:  Negative for dysuria and hematuria.  Skin:  Negative for itching and rash.  Neurological:  Negative for seizures and loss of consciousness.  Psychiatric/Behavioral:  Negative for depression. The patient is not nervous/anxious and does not have insomnia.       04/19/2023   10:57 AM 09/05/2022    9:23 AM 07/31/2022    3:51 PM  PHQ9 SCORE ONLY  PHQ-9 Total Score 0 0 4          Objective:     BP 110/72   Pulse 71   Temp 97.8 F (36.6 C) (Temporal)   Ht 5' 4 (1.626 m)   Wt 148 lb 4 oz (67.2 kg)   LMP 10/10/2023   SpO2 99%   BMI 25.45 kg/m  BP Readings from Last 3 Encounters:  10/24/23 110/72  04/29/23 112/68  04/19/23 118/80   Wt Readings from Last 3 Encounters:  10/24/23 148 lb 4 oz (67.2 kg)  04/29/23 159 lb (72.1 kg)  04/19/23 149 lb (67.6 kg)      Physical Exam Vitals reviewed.  Constitutional:      Appearance: Normal appearance.  HENT:     Head: Normocephalic and atraumatic.     Right Ear: Tympanic membrane, ear canal and external ear normal.     Left Ear: Tympanic membrane, ear canal and external ear normal.  Eyes:     General:        Right eye: No discharge.        Left eye: No discharge.     Extraocular Movements: Extraocular movements intact.     Conjunctiva/sclera: Conjunctivae normal.     Pupils: Pupils are equal, round, and reactive to light.  Neck:     Vascular: No carotid bruit.  Cardiovascular:     Rate and Rhythm:  Normal rate and regular rhythm.     Pulses: Normal pulses.     Heart sounds: Normal heart sounds. No murmur heard. Pulmonary:     Effort: Pulmonary effort is normal.     Breath sounds: Normal breath sounds.  Chest:  Breasts:    Breasts are symmetrical.     Right: Normal.     Left: Normal.  Abdominal:     General: Abdomen is flat. Bowel sounds are normal. There is no distension.     Palpations: Abdomen is soft. There is no mass.     Tenderness: There is no abdominal tenderness.  Musculoskeletal:        General: No tenderness.     Cervical back: Neck supple. No muscular tenderness.     Right lower leg: No edema.     Left lower leg: No edema.  Lymphadenopathy:     Cervical: No cervical adenopathy.     Upper Body:     Right upper body: No supraclavicular adenopathy.     Left upper body: No supraclavicular adenopathy.  Skin:    General: Skin is warm and dry.  Neurological:     General: No focal deficit present.     Mental Status: She is alert and oriented to person, place, and time.     Motor: No weakness.     Gait: Gait normal.  Psychiatric:        Mood and Affect: Mood normal.  Behavior: Behavior normal.        Judgment: Judgment normal.      No results found for any visits on 10/24/23.     Assessment & Plan:    Routine Health Maintenance and Physical Exam  Immunization History  Administered Date(s) Administered   Influenza,inj,Quad PF,6+ Mos 07/13/2019, 07/20/2020, 10/11/2022, 05/19/2023   Influenza-Unspecified 08/17/2021   Moderna SARS-COV2 Booster Vaccination 08/04/2020   Moderna Sars-Covid-2 Vaccination 12/07/2019, 01/04/2020, 06/12/2021   Tdap 09/17/2014    Health Maintenance  Topic Date Due   Colonoscopy  Never done   DTaP/Tdap/Td (2 - Td or Tdap) 09/17/2024   MAMMOGRAM  10/10/2024   Cervical Cancer Screening (HPV/Pap Cotest)  08/01/2027   INFLUENZA VACCINE  Completed   Hepatitis C Screening  Completed   HIV Screening  Completed   HPV VACCINES   Aged Out   COVID-19 Vaccine  Discontinued    Discussed health benefits of physical activity, and encouraged her to engage in regular exercise appropriate for her age and condition.  Problem List Items Addressed This Visit       Endocrine   Hypothyroidism   Chronic Continue levothyroxine  25 mcg daily Labs ordered, further recommendations may be made based upon these results       Relevant Orders   CBC   Comprehensive metabolic panel   Hemoglobin A1c   Lipid panel   TSH   VITAMIN D  25 Hydroxy (Vit-D Deficiency, Fractures)   T3, free   T4, free     Other   Vitamin D  deficiency disease   Labs ordered, further recommendations may be made based upon these results       Relevant Orders   CBC   Comprehensive metabolic panel   Hemoglobin A1c   Lipid panel   TSH   VITAMIN D  25 Hydroxy (Vit-D Deficiency, Fractures)   T3, free   T4, free   Hyperlipidemia   Labs ordered, further recommendations may be made based upon these results       Relevant Orders   CBC   Comprehensive metabolic panel   Hemoglobin A1c   Lipid panel   TSH   VITAMIN D  25 Hydroxy (Vit-D Deficiency, Fractures)   T3, free   T4, free   Encounter for general adult medical examination with abnormal findings - Primary   Healthy lifestyle recommendations discussed, screening recommendations discussed.  Patient due for colon cancer screening, she has been referred previously and feels comfortable calling GI provider on her own to schedule consultation regarding this.  She recently had screening mammogram that identified abnormality, follow-up diagnostic mammogram identified benign cysts.  It was recommended by radiologist for patient to be considered for possible MRI breast cancer screening annually moving forward.  Will consider this imaging modality when breast cancer screening due again next year.      Relevant Orders   CBC   Comprehensive metabolic panel   Hemoglobin A1c   Lipid panel   TSH    VITAMIN D  25 Hydroxy (Vit-D Deficiency, Fractures)   T3, free   T4, free   Diabetes mellitus screening   Labs ordered, further recommendations may be made based upon these results       Relevant Orders   CBC   Comprehensive metabolic panel   Hemoglobin A1c   Lipid panel   TSH   VITAMIN D  25 Hydroxy (Vit-D Deficiency, Fractures)   T3, free   T4, free   Hair loss   Labs ordered, further recommendations may be made based  upon these results       Relevant Orders   Vitamin B12   Ferritin   Iron   T3, free   T4, free   Return in about 7 months (around 05/23/2024) for F/U with Lauraine.     Lauraine FORBES Pereyra, NP

## 2023-11-12 ENCOUNTER — Other Ambulatory Visit (HOSPITAL_COMMUNITY): Payer: 59

## 2023-11-12 ENCOUNTER — Encounter (HOSPITAL_COMMUNITY): Payer: 59

## 2024-01-29 ENCOUNTER — Encounter: Payer: Self-pay | Admitting: Nurse Practitioner

## 2024-01-30 ENCOUNTER — Other Ambulatory Visit: Payer: Self-pay | Admitting: Nurse Practitioner

## 2024-01-30 DIAGNOSIS — R300 Dysuria: Secondary | ICD-10-CM

## 2024-02-15 LAB — URINALYSIS, ROUTINE W REFLEX MICROSCOPIC
Bacteria, UA: NONE SEEN /HPF
Bilirubin Urine: NEGATIVE
Glucose, UA: NEGATIVE
Hgb urine dipstick: NEGATIVE
Hyaline Cast: NONE SEEN /LPF
Ketones, ur: NEGATIVE
Nitrite: NEGATIVE
Protein, ur: NEGATIVE
RBC / HPF: NONE SEEN /HPF (ref 0–2)
Specific Gravity, Urine: 1.005 (ref 1.001–1.035)
pH: 6.5 (ref 5.0–8.0)

## 2024-02-15 LAB — BASIC METABOLIC PANEL WITH GFR
BUN: 12 mg/dL (ref 7–25)
CO2: 27 mmol/L (ref 20–32)
Calcium: 9.2 mg/dL (ref 8.6–10.2)
Chloride: 103 mmol/L (ref 98–110)
Creat: 0.72 mg/dL (ref 0.50–0.99)
Glucose, Bld: 81 mg/dL (ref 65–99)
Potassium: 4.7 mmol/L (ref 3.5–5.3)
Sodium: 137 mmol/L (ref 135–146)
eGFR: 104 mL/min/{1.73_m2} (ref >=60)

## 2024-02-15 LAB — URINE CULTURE: Result:: NO GROWTH

## 2024-02-17 ENCOUNTER — Ambulatory Visit: Payer: Self-pay | Admitting: Nurse Practitioner

## 2024-02-17 DIAGNOSIS — R300 Dysuria: Secondary | ICD-10-CM

## 2024-02-17 DIAGNOSIS — R8281 Pyuria: Secondary | ICD-10-CM

## 2024-02-18 ENCOUNTER — Telehealth: Payer: Self-pay

## 2024-02-18 NOTE — Telephone Encounter (Signed)
 Patient has an appt scheduled with you on 7/2. Patient wanted to ask what she should do in the meantime.  Feels bloated and abdominal discomfort, she has had four separate occasions of nausea.   I advised her to contact her PCP but she wanted to make you aware.

## 2024-02-26 NOTE — Telephone Encounter (Signed)
Pt has completed labs 

## 2024-03-06 NOTE — Progress Notes (Unsigned)
 Name: Earl E Kolakowski DOB: December 11, 1975 MRN: [id number]  History of Present Illness: Ms. Soulliere is a 48 y.o. female who presents today as a new patient at [facility] [place]. All available relevant medical records have been reviewed.   She reports chief complaint of dysuria.   Recent history:  > 02/14/2024:  - Urine microscopy: 10-20 WBC/hpf, otherwise unremarkable - Urine culture: negative - Normal renal function (creatinine 0.72, GFR 104).  Today: She reports ***  She {Actions; denies-reports:120008} urinary urgency, frequency, nocturia x***, dysuria, gross hematuria, ***weak urinary stream, hesitancy, straining to void, or sensations of incomplete emptying.  She {Actions; denies-reports:120008} flank pain or abdominal pain. She {Actions; denies-reports:120008} fevers, nausea, or vomiting.  Medications: Current Outpatient Medications  Medication Sig Dispense Refill   FLUoxetine  (PROZAC ) 20 MG capsule Take 1 capsule (20 mg total) by mouth daily. 90 capsule 3   fluticasone  (FLONASE ) 50 MCG/ACT nasal spray Place 2 sprays into both nostrils daily. 16 g 6   hydrOXYzine  (ATARAX ) 10 MG tablet Take 1 tablet (10 mg total) by mouth 3 (three) times daily as needed for itching. Caution drowsiness. 30 tablet 0   levothyroxine  (SYNTHROID ) 25 MCG tablet Take 1 tablet (25 mcg total) by mouth every other day. 45 tablet 3   NON FORMULARY 4 tablets daily. Nutrafol (4 tabs/day) also takes a pre/probiotic     No current facility-administered medications for this visit.    Allergies: Allergies  Allergen Reactions   Ceclor [Cefaclor]     Past Medical History:  Diagnosis Date   Congenital deformity of spine    Depression    Enlarged thyroid  12/16/2014   Hypothyroidism    OCD (obsessive compulsive disorder)    Scoliosis    Past Surgical History:  Procedure Laterality Date   arm surgery Right    cyst removed   BACK SURGERY     rod, 3 screws for scoliosis   LEG SURGERY  Right    birth mark removed   WISDOM TOOTH EXTRACTION     Family History  Problem Relation Age of Onset   Stroke Paternal Grandfather        x 2   Skin cancer Paternal Grandfather    Cancer Paternal Grandmother        breast   Thyroid  disease Paternal Grandmother    Other Paternal Grandmother        scolosis   Heart attack Maternal Grandmother    Hypertension Maternal Grandmother    Other Maternal Grandmother        osteoporosis   Other Maternal Grandfather        aneursym   Stroke Maternal Grandfather    Cancer Father        melonoma   Hypertension Father    Thyroid  disease Mother    Hypertension Mother    Other Mother        sleep apnea; degenerative disc   Breast cancer Mother 7   Glaucoma Mother    Thyroid  disease Brother        hyperthyroidism   Social History   Socioeconomic History   Marital status: Single    Spouse name: Not on file   Number of children: Not on file   Years of education: Not on file   Highest education level: Not on file  Occupational History   Not on file  Tobacco Use   Smoking status: Never   Smokeless tobacco: Never  Vaping Use   Vaping status: Never Used  Substance and  Sexual Activity   Alcohol use: Yes    Comment: occasional   Drug use: No   Sexual activity: Never    Birth control/protection: None  Other Topics Concern   Not on file  Social History Narrative   Not on file   Social Drivers of Health   Financial Resource Strain: Low Risk  (07/31/2022)   Overall Financial Resource Strain (CARDIA)    Difficulty of Paying Living Expenses: Not very hard  Food Insecurity: No Food Insecurity (07/31/2022)   Hunger Vital Sign    Worried About Running Out of Food in the Last Year: Never true    Ran Out of Food in the Last Year: Never true  Transportation Needs: No Transportation Needs (07/31/2022)   PRAPARE - Administrator, Civil Service (Medical): No    Lack of Transportation (Non-Medical): No  Physical Activity:  Insufficiently Active (07/31/2022)   Exercise Vital Sign    Days of Exercise per Week: 2 days    Minutes of Exercise per Session: 40 min  Stress: No Stress Concern Present (07/31/2022)   [employer] of Occupational Health - Occupational Stress Questionnaire    Feeling of Stress : Only a little  Social Connections: Socially Isolated (07/31/2022)   Social Connection and Isolation Panel    Frequency of Communication with Friends and Family: Once a week    Frequency of Social Gatherings with Friends and Family: Once a week    Attends Religious Services: More than 4 times per year    Active Member of [employer] or Organizations: No    Attends Banker Meetings: Never    Marital Status: Never married  Intimate Partner Violence: Not At Risk (07/31/2022)   Humiliation, Afraid, Rape, and Kick questionnaire    Fear of Current or Ex-Partner: No    Emotionally Abused: No    Physically Abused: No    Sexually Abused: No    SUBJECTIVE  Review of Systems Constitutional: Patient denies any unintentional weight loss or change in strength lntegumentary: Patient denies any rashes or pruritus Cardiovascular: Patient denies chest pain or syncope Respiratory: Patient denies shortness of breath Gastrointestinal: ***Patient denies nausea, vomiting, constipation, or diarrhea ***As per HPI Musculoskeletal: Patient denies muscle cramps or weakness Neurologic: Patient denies convulsions or seizures Allergic/Immunologic: Patient denies recent allergic reaction(s) Hematologic/Lymphatic: Patient denies bleeding tendencies Endocrine: Patient denies heat/cold intolerance  GU: As per HPI.  OBJECTIVE There were no vitals filed for this visit. There is no height or weight on file to calculate BMI.  Physical Examination Constitutional: No obvious distress; patient is non-toxic appearing  Cardiovascular: No visible lower extremity edema.  Respiratory: The patient does not have audible  wheezing/stridor; respirations do not appear labored  Gastrointestinal: Abdomen non-distended Musculoskeletal: Normal ROM of UEs  Skin: No obvious rashes/open sores  Neurologic: CN 2-12 grossly intact Psychiatric: Answered questions appropriately with normal affect  Hematologic/Lymphatic/Immunologic: No obvious bruises or sites of spontaneous bleeding  UA: ***negative ***positive for *** leukocytes, *** blood, ***nitrites Urine microscopy: *** WBC/hpf, *** RBC/hpf, *** bacteria ***glucosuria (secondary to ***Jardiance ***Farxiga use) ***otherwise unremarkable  PVR: *** ml  ASSESSMENT No diagnosis found. ***  We agreed to plan for follow up in *** months or sooner if needed. Patient verbalized understanding of and agreement with current plan. All questions were answered.  PLAN Advised the following: *** ***No follow-ups on file.  No orders of the defined types were placed in this encounter.   It has been explained that the patient is  to follow regularly with their PCP in addition to all other providers involved in their care and to follow instructions provided by these respective offices. Patient advised to contact urology clinic if any urologic-pertaining questions, concerns, new symptoms or problems arise in the interim period.  There are no Patient Instructions on file for this visit.  Electronically signed by:  Lauretta Ponto, MSN, FNP-C, CUNP 03/06/2024 1:14 PM

## 2024-03-10 ENCOUNTER — Ambulatory Visit: Admitting: Urology

## 2024-03-10 ENCOUNTER — Encounter: Payer: Self-pay | Admitting: Urology

## 2024-03-10 VITALS — BP 112/72 | HR 73 | Temp 98.5°F

## 2024-03-10 DIAGNOSIS — R300 Dysuria: Secondary | ICD-10-CM | POA: Diagnosis not present

## 2024-03-10 DIAGNOSIS — N3944 Nocturnal enuresis: Secondary | ICD-10-CM | POA: Diagnosis not present

## 2024-03-10 DIAGNOSIS — N3281 Overactive bladder: Secondary | ICD-10-CM | POA: Diagnosis not present

## 2024-03-10 DIAGNOSIS — N3946 Mixed incontinence: Secondary | ICD-10-CM

## 2024-03-10 DIAGNOSIS — Z87898 Personal history of other specified conditions: Secondary | ICD-10-CM

## 2024-03-10 DIAGNOSIS — R8290 Unspecified abnormal findings in urine: Secondary | ICD-10-CM

## 2024-03-10 LAB — URINALYSIS, ROUTINE W REFLEX MICROSCOPIC
Bilirubin, UA: NEGATIVE
Glucose, UA: NEGATIVE
Ketones, UA: NEGATIVE
Nitrite, UA: NEGATIVE
Protein,UA: NEGATIVE
Specific Gravity, UA: <1.005 — ABNORMAL LOW (ref 1.005–1.030)
Urobilinogen, Ur: 0.2 mg/dL (ref 0.2–1.0)
pH, UA: 6 (ref 5.0–7.5)

## 2024-03-10 LAB — BLADDER SCAN AMB NON-IMAGING: Scan Result: 0

## 2024-03-10 LAB — MICROSCOPIC EXAMINATION: Epithelial Cells (non renal): >10 /HPF — AB (ref 0–10)

## 2024-03-10 NOTE — Progress Notes (Signed)
 post void residual=0 ?

## 2024-03-10 NOTE — Patient Instructions (Signed)
 SABRA

## 2024-03-12 ENCOUNTER — Ambulatory Visit: Payer: Self-pay | Admitting: Urology

## 2024-03-12 LAB — URINE CULTURE: Organism ID, Bacteria: NO GROWTH

## 2024-03-18 ENCOUNTER — Ambulatory Visit: Admitting: Urology

## 2024-03-23 ENCOUNTER — Ambulatory Visit: Admitting: Urology

## 2024-03-30 ENCOUNTER — Telehealth: Payer: Self-pay

## 2024-03-30 NOTE — Telephone Encounter (Signed)
 Copied from CRM [id number]. Topic: Clinical - Medication Question >> Mar 30, 2024  3:35 PM Jessica Klein wrote: Reason for CRM: patient wants to know if she can have the b-12 shot picked up at the pharmacy and administered at home or just take it in pill form because she stated the office is 45 min away. Patient would like a call back

## 2024-03-31 ENCOUNTER — Ambulatory Visit (HOSPITAL_COMMUNITY): Admitting: Physical Therapy

## 2024-04-07 ENCOUNTER — Encounter (HOSPITAL_COMMUNITY): Admitting: Physical Therapy

## 2024-04-14 ENCOUNTER — Encounter (HOSPITAL_COMMUNITY): Admitting: Physical Therapy

## 2024-04-17 ENCOUNTER — Other Ambulatory Visit: Payer: Self-pay | Admitting: Nurse Practitioner

## 2024-04-17 DIAGNOSIS — E039 Hypothyroidism, unspecified: Secondary | ICD-10-CM

## 2024-04-17 DIAGNOSIS — F419 Anxiety disorder, unspecified: Secondary | ICD-10-CM

## 2024-04-21 ENCOUNTER — Encounter (HOSPITAL_COMMUNITY): Admitting: Physical Therapy

## 2024-04-28 ENCOUNTER — Other Ambulatory Visit: Payer: Self-pay

## 2024-04-28 ENCOUNTER — Ambulatory Visit (HOSPITAL_COMMUNITY): Attending: Nurse Practitioner | Admitting: Physical Therapy

## 2024-04-28 DIAGNOSIS — N3946 Mixed incontinence: Secondary | ICD-10-CM | POA: Insufficient documentation

## 2024-04-28 NOTE — Therapy (Signed)
 OUTPATIENT PHYSICAL THERAPY FEMALE PELVIC EVALUATION   Patient Name: Jessica Klein MRN: [id number] DOB:[birth year], 48 y.o., female Today's Date: 04/28/2024  END OF SESSION:  PT End of Session - 04/28/24 0951     Visit Number 1    Number of Visits 4    Date for PT Re-Evaluation 05/22/24    Authorization Type Aetna    PT Start Time 0815    PT Stop Time 0901    PT Time Calculation (min) 46 min    Activity Tolerance Patient tolerated treatment well    Behavior During Therapy [facility] for tasks assessed/performed          Past Medical History:  Diagnosis Date   Congenital deformity of spine    Depression    Enlarged thyroid  12/16/2014   Hypothyroidism    OCD (obsessive compulsive disorder)    Scoliosis    Past Surgical History:  Procedure Laterality Date   arm surgery Right    cyst removed   BACK SURGERY     rod, 3 screws for scoliosis   LEG SURGERY Right    birth mark removed   WISDOM TOOTH EXTRACTION     Patient Active Problem List   Diagnosis Date Noted   Mixed stress and urge urinary incontinence 03/10/2024   Hair loss 10/24/2023   Anxiety 04/19/2023   Vitamin D  deficiency disease 04/19/2022   Hyperlipidemia 04/19/2022   Alopecia 08/10/2019   OCD (obsessive compulsive disorder)    Depression    Hypothyroidism    Major depressive disorder, single episode, unspecified 07/09/2018   Enlarged thyroid  12/16/2014    PCP: Jessica Klein  REFERRING PROVIDER: Gerldine Klein BROCKS, FNP  REFERRING DIAG:  Diagnosis  N39.46 (ICD-10-CM) - Mixed stress and urge urinary incontinence    THERAPY DIAG:  Mixed incontinence - Plan: PT plan of care cert/re-cert  Rationale for Evaluation and Treatment: Rehabilitation  ONSET DATE: years   SUBJECTIVE:                                                                                                                                                                                           SUBJECTIVE STATEMENT: Pt states that she  uses pads intermittently.  Fluid intake: Pt states that her water intake is up and down.  She currently voids approximately 3-4 x in an hour initially  and then it will even out.  PT states she feels that she is not emptying her bladder, however, she had the Ultrasound test which demonstrated that she was indeed emptying her bladder.   PAIN:  Are you having pain? No  PRECAUTIONS: None  RED FLAGS: None  WEIGHT BEARING RESTRICTIONS: No  FALLS:  Has patient fallen in last 6 months? No  OCCUPATION: Conservator, museum/gallery   ACTIVITY LEVEL : moderate   PLOF: Independent  PATIENT GOALS: less leakage.   PERTINENT HISTORY:  She reports urgency, and urge incontinence. Intermittently basis daytime frequency, nocturia x1, and nocturnal enuresis over the past 15 years. She reports stress incontinence with cough/laugh/sneeze. She reports the UUI is predominant.  She leaks several times per week (varies). Wears pads / protective pads on bed. She reports urinary incontinence is significantly bothersome.    BOWEL MOVEMENT: Pain with bowel movement: No Type of bowel movement:Strain none Fully empty rectum: Yes:   Leakage: No Pads: No Fiber supplement/laxative No  URINATION: Pain with urination: No Fully empty bladder: No Stream: Strong Urgency: Yes  Frequency: up to 3 times an hour  Leakage: Urge to void, Walking to the bathroom, Coughing, Sneezing, Laughing, Exercise, and randomly void in her sleep  Pads: No-incontinence issue is very random   PREGNANCY: none   PROLAPSE:  no    OBJECTIVE:  Note: Objective measures were completed at Evaluation unless otherwise noted. COGNITION: Overall cognitive status: Within functional limits for tasks assessed      POSTURE: No Significant postural limitations  Abdominal MMT:3+/5     TODAY'S TREATMENT:                                                                                                                              DATE:   04/28/24 EVAL:               Kegal:  fast twitch 2 sec contraction / 4 second relax x 10                            Slow:  10 second hold rest 20 x 8 seconds  Abdominal:     sitting: Lower abdominal 10 x 5                        Quadriped:  Lower abdominal 5 x 5    PATIENT EDUCATION:  Education details: HEP Person educated: Patient Education method: Programmer, multimedia, Verbal cues, and Handouts Education comprehension: verbalized understanding and returned demonstration  HOME EXERCISE PROGRAM:  Kegal:  fast twitch 2 sec contraction / 4 second relax x 10                            Slow:  10 second hold rest 20 x 8 seconds  Abdominal:     sitting: Lower abdominal 10 x 5                        Quadriped:  Lower abdominal 5 x 5    ASSESSMENT:  CLINICAL IMPRESSION: Patient is a 48 y.o. female  who was seen today for  physical therapy evaluation and treatment for mixed incontinence.  Pt has noted decreased strength of her pelvic as well as her lower abdominal strength which has caused her to have incontinence issues.  Jessica Klein will benefit from skilled PT to address her weakness and improve her continence. .   OBJECTIVE IMPAIRMENTS: decreased strength.   ACTIVITY LIMITATIONS: continence and hygiene/grooming   REHAB POTENTIAL: Good  CLINICAL DECISION MAKING: Stable/uncomplicated  EVALUATION COMPLEXITY: Moderate   GOALS: Goals reviewed with patient? Yes  SHORT TERM GOALS: Target date: 05/08/24  Pt to be I in HEP in order to strengthen her pelvic mm to note that she is having 20% fewer accidents.  Baseline: Goal status: INITIAL Goal status: INITIAL  LONG TERM GOALS: Target date: 05/22/24  PT to be I in advanced HEP in order to not that she is having 50% fewer accidents.  Baseline:  Goal status: INITIAL    PLAN:  PT FREQUENCY: 1x/week  PT DURATION: 4 weeks  PLANNED INTERVENTIONS: 97110-Therapeutic exercises and 02464- Self Care  PLAN FOR NEXT SESSION: begin heel slides,  hip abduction, prone hip extension and attempt quadriped extension    Montie Metro, PT CLT [phone]  04/28/2024, 9:53 AM

## 2024-05-05 ENCOUNTER — Ambulatory Visit (HOSPITAL_COMMUNITY): Admitting: Physical Therapy

## 2024-05-05 DIAGNOSIS — N3946 Mixed incontinence: Secondary | ICD-10-CM

## 2024-05-05 NOTE — Therapy (Signed)
 OUTPATIENT PHYSICAL THERAPY FEMALE PELVIC Treatment   Patient Name: Jessica Klein MRN: [id number] DOB:08-08-1976, 48 y.o., female Today's Date: 05/05/2024  END OF SESSION:  PT End of Session - 05/05/24 0911     Visit Number 2    Number of Visits 4    Date for PT Re-Evaluation 05/22/24    Authorization Type Aetna    PT Start Time 0815    PT Stop Time 0901    PT Time Calculation (min) 46 min    Activity Tolerance Patient tolerated treatment well    Behavior During Therapy [facility] for tasks assessed/performed           Past Medical History:  Diagnosis Date   Congenital deformity of spine    Depression    Enlarged thyroid  12/16/2014   Hypothyroidism    OCD (obsessive compulsive disorder)    Scoliosis    Past Surgical History:  Procedure Laterality Date   arm surgery Right    cyst removed   BACK SURGERY     rod, 3 screws for scoliosis   LEG SURGERY Right    birth mark removed   WISDOM TOOTH EXTRACTION     Patient Active Problem List   Diagnosis Date Noted   Mixed stress and urge urinary incontinence 03/10/2024   Hair loss 10/24/2023   Anxiety 04/19/2023   Vitamin D  deficiency disease 04/19/2022   Hyperlipidemia 04/19/2022   Alopecia 08/10/2019   OCD (obsessive compulsive disorder)    Depression    Hypothyroidism    Major depressive disorder, single episode, unspecified 07/09/2018   Enlarged thyroid  12/16/2014    PCP: Elnor Domino  REFERRING PROVIDER: Gerldine Domino BROCKS, FNP  REFERRING DIAG:  Diagnosis  N39.46 (ICD-10-CM) - Mixed stress and urge urinary incontinence    THERAPY DIAG:  Mixed incontinence  Rationale for Evaluation and Treatment: Rehabilitation  ONSET DATE: years   SUBJECTIVE:                                                                                                                                                                                           SUBJECTIVE STATEMENT: Pt states that she has been completing her exercises but  not on a regular basis. So far she has not noted any difference.   Fluid intake: Pt stat' s that her water intake is up and down.  She currently voids approximately 3-4 x in an hour initially  and then it will even out.  PT states she feels that she is not emptying her bladder, however, she had the Ultrasound test which demonstrated that she was indeed emptying her bladder.   PAIN:  Are you having pain? No  PRECAUTIONS: None  RED FLAGS: None   WEIGHT BEARING RESTRICTIONS: No  FALLS:  Has patient fallen in last 6 months? No  OCCUPATION: Conservator, museum/gallery   ACTIVITY LEVEL : moderate   PLOF: Independent  PATIENT GOALS: less leakage.   PERTINENT HISTORY:  She reports urgency, and urge incontinence. Intermittently basis daytime frequency, nocturia x1, and nocturnal enuresis over the past 15 years. She reports stress incontinence with cough/laugh/sneeze. She reports the UUI is predominant.  She leaks several times per week (varies). Wears pads / protective pads on bed. She reports urinary incontinence is significantly bothersome.    BOWEL MOVEMENT: Pain with bowel movement: No Type of bowel movement:Strain none Fully empty rectum: Yes:   Leakage: No Pads: No Fiber supplement/laxative No  URINATION: Pain with urination: No Fully empty bladder: No Stream: Strong Urgency: Yes  Frequency: up to 3 times an hour  Leakage: Urge to void, Walking to the bathroom, Coughing, Sneezing, Laughing, Exercise, and randomly void in her sleep  Pads: No-incontinence issue is very random   PREGNANCY: none   PROLAPSE:  no    OBJECTIVE:  Note: Objective measures were completed at Evaluation unless otherwise noted. COGNITION: Overall cognitive status: Within functional limits for tasks assessed      POSTURE: No Significant postural limitations  Abdominal MMT:3+/5     TODAY'S TREATMENT:                                                                                                                               DATE:  05/05/24              Kegal:  fast twitch 2 sec contraction / 4 second relax x 10                            Slow:  40 second hold  Abdominal:  supine:  heelslides x 15 B                                   Bridge x 10                Side lying:  hip abduction x 10 B                     Prone:   hip extension x 10               Quadriped:   Rt hip extension x 5                                    Lt knee unweighting only  PATIENT EDUCATION:  Education details: HEP Person educated: Patient Education method: Programmer, multimedia, Verbal cues, and Handouts Education comprehension: verbalized  understanding and returned demonstration  HOME EXERCISE PROGRAM: 05/05/24            supine:  heelslides x 15 B                                   Bridge x 10                Side lying:  hip abduction x 10 B                     Prone:   hip extension x 10               Quadriped:  hip extension x 5                        Eval:   Kegal:  fast twitch 2 sec contraction / 4 second relax x 10                            Slow:  10 second hold rest 20 x 8 seconds  Abdominal:     sitting: Lower abdominal 10 x 5                        Quadriped:  Lower abdominal 5 x 5    ASSESSMENT:  CLINICAL IMPRESSION: Today's treatment focused on strengthening lower abdominal as well as pelvic mm  for mixed incontinence.  PT unable to complete quadriped Lt hip extension properly therefore had pt unweight the knee only.  Ms. Piotrowski will benefit from skilled PT to address her weakness and improve her continence.   OBJECTIVE IMPAIRMENTS: decreased strength.   ACTIVITY LIMITATIONS: continence and hygiene/grooming   REHAB POTENTIAL: Good  CLINICAL DECISION MAKING: Stable/uncomplicated  EVALUATION COMPLEXITY: Moderate   GOALS: Goals reviewed with patient? Yes  SHORT TERM GOALS: Target date: 05/08/24  Pt to be I in HEP in order to strengthen her pelvic mm to note that she is having 20% fewer  accidents.  Baseline: Goal status: on-going   LONG TERM GOALS: Target date: 05/22/24  PT to be I in advanced HEP in order to not that she is having 50% fewer accidents.  Baseline:  Goal status: on-going     PLAN:  PT FREQUENCY: 1x/week  PT DURATION: 4 weeks  PLANNED INTERVENTIONS: 97110-Therapeutic exercises and 02464- Self Care  PLAN FOR NEXT SESSION: begin theraband resisted exercises.    Montie Metro, PT CLT [phone]  05/05/2024, 9:11 AM

## 2024-05-12 ENCOUNTER — Ambulatory Visit (HOSPITAL_COMMUNITY): Admitting: Physical Therapy

## 2024-05-12 DIAGNOSIS — N3946 Mixed incontinence: Secondary | ICD-10-CM

## 2024-05-12 NOTE — Therapy (Signed)
 OUTPATIENT PHYSICAL THERAPY FEMALE PELVIC Treatment   Patient Name: Jessica Klein MRN: [id number] DOB:Aug 19, 1976, 48 y.o., female Today's Date: 05/12/2024  END OF SESSION:  PT End of Session - 05/12/24 0915     Visit Number 3    Number of Visits 4    Date for PT Re-Evaluation 05/22/24    Authorization Type [employer]    PT Start Time [phone]    PT Stop Time 0915    PT Time Calculation (min) 58 min    Activity Tolerance Patient tolerated treatment well    Behavior During Therapy [facility] for tasks assessed/performed            Past Medical History:  Diagnosis Date   Congenital deformity of spine    Depression    Enlarged thyroid  12/16/2014   Hypothyroidism    OCD (obsessive compulsive disorder)    Scoliosis    Past Surgical History:  Procedure Laterality Date   arm surgery Right    cyst removed   BACK SURGERY     rod, 3 screws for scoliosis   LEG SURGERY Right    birth mark removed   WISDOM TOOTH EXTRACTION     Patient Active Problem List   Diagnosis Date Noted   Mixed stress and urge urinary incontinence 03/10/2024   Hair loss 10/24/2023   Anxiety 04/19/2023   Vitamin D  deficiency disease 04/19/2022   Hyperlipidemia 04/19/2022   Alopecia 08/10/2019   OCD (obsessive compulsive disorder)    Depression    Hypothyroidism    Major depressive disorder, single episode, unspecified 07/09/2018   Enlarged thyroid  12/16/2014    PCP: Elnor Domino  REFERRING PROVIDER: Gerldine Domino BROCKS, FNP  REFERRING DIAG:  Diagnosis  N39.46 (ICD-10-CM) - Mixed stress and urge urinary incontinence    THERAPY DIAG:  Mixed incontinence  Rationale for Evaluation and Treatment: Rehabilitation  ONSET DATE: years   SUBJECTIVE:                                                                                                                                                                                           SUBJECTIVE STATEMENT:  Pt states that she had a kickball game yesterday when  she was running she had some leakage.  She has only been to the gym once since last visit so she did not keep her lower abdominal contraction with her workout.   PAIN:  Are you having pain? No  PRECAUTIONS: None  RED FLAGS: None   WEIGHT BEARING RESTRICTIONS: No  FALLS:  Has patient fallen in last 6 months? No  OCCUPATION: Conservator, museum/gallery   ACTIVITY LEVEL : moderate  PLOF: Independent  PATIENT GOALS: less leakage.   PERTINENT HISTORY:  She reports urgency, and urge incontinence. Intermittently basis daytime frequency, nocturia x1, and nocturnal enuresis over the past 15 years. She reports stress incontinence with cough/laugh/sneeze. She reports the UUI is predominant.  She leaks several times per week (varies). Wears pads / protective pads on bed. She reports urinary incontinence is significantly bothersome.    BOWEL MOVEMENT: Pain with bowel movement: No Type of bowel movement:Strain none Fully empty rectum: Yes:   Leakage: No Pads: No Fiber supplement/laxative No  URINATION: Pain with urination: No Fully empty bladder: No Stream: Strong Urgency: Yes  Frequency: up to 3 times an hour  Leakage: Urge to void, Walking to the bathroom, Coughing, Sneezing, Laughing, Exercise, and randomly void in her sleep  Pads: No-incontinence issue is very random   PREGNANCY: none   PROLAPSE:  no    OBJECTIVE:  Note: Objective measures were completed at Evaluation unless otherwise noted. COGNITION: Overall cognitive status: Within functional limits for tasks assessed      POSTURE: No Significant postural limitations  Abdominal MMT:3+/5     TODAY'S TREATMENT:                                                                                                                              DATE:  05/12/24              Kegal:  fast twitch 2 sec contraction / 4 second relax x 10                            Slow:  40 second hold  Abdominal:  standing theraband:                      Shoulder adduction x 10 B Rt then LT                      Shoulder flexion x 10      B Rt then LT                      Shoulder extension x 10 B  together.                      Shoulder D2  Rt then Lt   x 10                      Shoulder D1 Rt then Lt x 10              Quadriped:   Rt/ Lt  hip extension x 5                                    PATIENT EDUCATION:  Education details: HEP  Person educated: Patient Education method: Explanation, Verbal cues, and Handouts Education comprehension: verbalized understanding and returned demonstration  HOME EXERCISE PROGRAM:               05/12/24           standing theraband:                     Shoulder adduction x 10                       Shoulder flexion x 10                           Shoulder extension x 10                      Shoulder D2  Rt then Lt   x 10                      Shoulder D1 Rt then Lt x 10 05/05/24            supine:  heelslides x 15 B                                   Bridge x 10                Side lying:  hip abduction x 10 B                     Prone:   hip extension x 10               Quadriped:  hip extension x 5                        Eval:   Kegal:  fast twitch 2 sec contraction / 4 second relax x 10                            Slow:  10 second hold rest 20 x 8 seconds  Abdominal:     sitting: Lower abdominal 10 x 5                        Quadriped:  Lower abdominal 5 x 5    ASSESSMENT:  CLINICAL IMPRESSION: Today's treatment focused on strengthening lower abdominal as well as pelvic mm  for mixed incontinence.  PT able to complete quadriped Lt hip extension today.  Added Theraband resisted exercises.    [name] will benefit from skilled PT to address her weakness and improve her continence.   OBJECTIVE IMPAIRMENTS: decreased strength.   ACTIVITY LIMITATIONS: continence and hygiene/grooming   REHAB POTENTIAL: Good  CLINICAL DECISION MAKING: Stable/uncomplicated  EVALUATION COMPLEXITY:  Moderate   GOALS: Goals reviewed with patient? Yes  SHORT TERM GOALS: Target date: 05/08/24  Pt to be I in HEP in order to strengthen her pelvic mm to note that she is having 20% fewer accidents.  Baseline: Goal status: partially met  LONG TERM GOALS: Target date: 05/22/24  PT to be I in advanced HEP in order to not that she is having 50% fewer accidents.  Baseline:  Goal status: on-going     PLAN:  PT FREQUENCY: 1x/week  PT DURATION: 4 weeks  PLANNED INTERVENTIONS: 97110-Therapeutic exercises and 02464- Self Care  PLAN FOR NEXT SESSION: Review exercises in two weeks.  Montie Metro, PT CLT [phone]  05/12/2024, 9:16 AM

## 2024-05-19 ENCOUNTER — Encounter (HOSPITAL_COMMUNITY): Admitting: Physical Therapy

## 2024-05-26 ENCOUNTER — Ambulatory Visit (HOSPITAL_COMMUNITY): Admitting: Physical Therapy

## 2024-05-28 ENCOUNTER — Ambulatory Visit: Payer: 59 | Admitting: Nurse Practitioner

## 2024-05-28 VITALS — BP 130/76 | HR 81 | Temp 98.1°F | Ht 61.0 in

## 2024-05-28 DIAGNOSIS — E538 Deficiency of other specified B group vitamins: Secondary | ICD-10-CM | POA: Diagnosis not present

## 2024-05-28 DIAGNOSIS — Z23 Encounter for immunization: Secondary | ICD-10-CM

## 2024-05-28 DIAGNOSIS — Z131 Encounter for screening for diabetes mellitus: Secondary | ICD-10-CM

## 2024-05-28 DIAGNOSIS — E039 Hypothyroidism, unspecified: Secondary | ICD-10-CM

## 2024-05-28 DIAGNOSIS — E785 Hyperlipidemia, unspecified: Secondary | ICD-10-CM | POA: Diagnosis not present

## 2024-05-28 NOTE — Assessment & Plan Note (Signed)
 Hypothyroidism Managed with levothyroxine  25 mcg every other day. No palpitations, but increased fatigue. Thyroid  levels well-managed. - Check TSH, T3, and T4 levels with upcoming lab work.

## 2024-05-28 NOTE — Assessment & Plan Note (Signed)
 Hyperlipidemia Total cholesterol 228, LDL 147. Family history of high cholesterol. Low risk of heart attack or stroke in ten years (ASCVD rescore 0.8%).  - Order cholesterol panel with liver enzymes. - Discuss dietary modifications to reduce cholesterol intake, focusing on plant-based proteins and lean meats. - Consider CT calcium score to assess plaque formation if desired.

## 2024-05-28 NOTE — Assessment & Plan Note (Signed)
 Vitamin B12 deficiency Vitamin B12 level at 204. Prefers B12 injections for faster absorption and convenience. - Discuss with supervising physician about setting up B12 injections for home administration. - Provide instructions for intramuscular injection if home administration is approved. - If home administration is not approved, recommend oral B12 supplementation. - Recheck B12 levels after initiating treatment.

## 2024-05-28 NOTE — Progress Notes (Signed)
 Established Patient Office Visit  Subjective   Patient ID: Jessica Klein, female    DOB: September 06, 1976  Age: 48 y.o. MRN: [id number]  Chief Complaint  Patient presents with   Hyperlipidemia    Discussed the use of AI scribe software for clinical note transcription with the patient, who gave verbal consent to proceed.  History of Present Illness Jessica Klein is a 48 year old female with hypothyroidism and hyperlipidemia who presents for a follow-up visit.  Fatigue - Increased fatigue present - Currently taking levothyroxine  25 micrograms daily for hypothyroidism  Vitamin b12 deficiency - Previous B12 level low at 204  Hyperlipidemia - Concern regarding elevated cholesterol levels - Total cholesterol previously 228 mg/dL - LDL previously 852 mg/dL - Family history of hypercholesterolemia and dementia - Considering dietary modifications for cholesterol management       ROS: see HPI    Objective:     BP 130/76   Pulse 81   Temp 98.1 F (36.7 C) (Temporal)   Ht 5' 1 (1.549 m)   SpO2 97%   BMI 28.01 kg/m  BP Readings from Last 3 Encounters:  05/28/24 130/76  03/10/24 112/72  10/24/23 110/72   Wt Readings from Last 3 Encounters:  10/24/23 148 lb 4 oz (67.2 kg)  04/29/23 159 lb (72.1 kg)  04/19/23 149 lb (67.6 kg)      Physical Exam Vitals reviewed.  Constitutional:      General: She is not in acute distress.    Appearance: Normal appearance.  HENT:     Head: Normocephalic and atraumatic.  Neck:     Vascular: No carotid bruit.  Cardiovascular:     Rate and Rhythm: Normal rate and regular rhythm.     Pulses: Normal pulses.     Heart sounds: Normal heart sounds.  Pulmonary:     Effort: Pulmonary effort is normal.     Breath sounds: Normal breath sounds.  Skin:    General: Skin is warm and dry.  Neurological:     General: No focal deficit present.     Mental Status: She is alert and oriented to person, place, and time.  Psychiatric:         Mood and Affect: Mood normal.        Behavior: Behavior normal.        Judgment: Judgment normal.      No results found for any visits on 05/28/24.    The 10-year ASCVD risk score (Arnett DK, et al., 2019) is: 0.8%    Assessment & Plan:   Problem List Items Addressed This Visit       Endocrine   Hypothyroidism   Hypothyroidism Managed with levothyroxine  25 mcg every other day. No palpitations, but increased fatigue. Thyroid  levels well-managed. - Check TSH, T3, and T4 levels with upcoming lab work.      Relevant Orders   CBC   Comprehensive metabolic panel with GFR   Hemoglobin A1c   Lipid panel   TSH   T3, free   T4, free     Other   Hyperlipidemia - Primary   Hyperlipidemia Total cholesterol 228, LDL 147. Family history of high cholesterol. Low risk of heart attack or stroke in ten years (ASCVD rescore 0.8%).  - Order cholesterol panel with liver enzymes. - Discuss dietary modifications to reduce cholesterol intake, focusing on plant-based proteins and lean meats. - Consider CT calcium score to assess plaque formation if desired.      Relevant Orders  CBC   Comprehensive metabolic panel with GFR   Hemoglobin A1c   Lipid panel   TSH   T3, free   T4, free   B12 deficiency   Vitamin B12 deficiency Vitamin B12 level at 204. Prefers B12 injections for faster absorption and convenience. - Discuss with supervising physician about setting up B12 injections for home administration. - Provide instructions for intramuscular injection if home administration is approved. - If home administration is not approved, recommend oral B12 supplementation. - Recheck B12 levels after initiating treatment.      Need for vaccination   Hyperlipidemia Total cholesterol 228, LDL 147. Family history of high cholesterol. Low risk of heart attack or stroke in ten years (ASCVD rescore 0.8%).  - Order cholesterol panel with liver enzymes. - Discuss dietary modifications to reduce  cholesterol intake, focusing on plant-based proteins and lean meats. - Consider CT calcium score to assess plaque formation if desired.      Relevant Orders   Flu vaccine trivalent PF, 6mos and older(Flulaval,Afluria,Fluarix,Fluzone) (Completed)   Other Visit Diagnoses       Diabetes mellitus screening       Relevant Orders   CBC   Comprehensive metabolic panel with GFR   Hemoglobin A1c   Lipid panel   TSH   T3, free   T4, free      Assessment and Plan Assessment & Plan Vitamin B12 deficiency Vitamin B12 level at 204. Prefers B12 injections for faster absorption and convenience. - Discuss with supervising physician about setting up B12 injections for home administration. - Provide instructions for intramuscular injection if home administration is approved. - If home administration is not approved, recommend oral B12 supplementation. - Recheck B12 levels after initiating treatment.  Hypothyroidism Managed with levothyroxine  25 mcg every other day. No palpitations, but increased fatigue. Thyroid  levels well-managed. - Check TSH, T3, and T4 levels with upcoming lab work.  Hyperlipidemia Total cholesterol 228, LDL 147. Family history of high cholesterol. Low risk of heart attack or stroke in ten years (ASCVD rescore 0.8%).  - Order cholesterol panel with liver enzymes. - Discuss dietary modifications to reduce cholesterol intake, focusing on plant-based proteins and lean meats. - Consider CT calcium score to assess plaque formation if desired.  Hyperlipidemia Total cholesterol 228, LDL 147. Family history of high cholesterol. Low risk of heart attack or stroke in ten years (ASCVD rescore 0.8%).  - Order cholesterol panel with liver enzymes. - Discuss dietary modifications to reduce cholesterol intake, focusing on plant-based proteins and lean meats. - Consider CT calcium score to assess plaque formation if desired.   Return in about 5 months (around 10/28/2024).    Lauraine FORBES Pereyra, NP

## 2024-05-29 ENCOUNTER — Other Ambulatory Visit: Payer: Self-pay | Admitting: Nurse Practitioner

## 2024-05-29 DIAGNOSIS — E538 Deficiency of other specified B group vitamins: Secondary | ICD-10-CM

## 2024-05-29 MED ORDER — BD SAFETYGLIDE SYRINGE/NEEDLE 25G X 1" 3 ML MISC: 0 refills | Status: AC

## 2024-05-29 MED ORDER — CYANOCOBALAMIN 1000 MCG/ML IJ SOLN: INTRAMUSCULAR | 1 refills | Status: AC

## 2024-05-29 NOTE — Progress Notes (Signed)
 Please call patient and let her know that I was able to prescribe vitamin B12 to her pharmacy.  I was also able to prescribe needles and syringes to her pharmacy.  She should inject 1 mL of vitamin B12 into her muscle once a week x 4 weeks and then once a month thereafter.  Please remind her that she should clean the injection site with alcohol swab before injecting, she should also clean the vitamin B-12 vial with alcohol swab before pulling the medication up into the syringe.  Please also inform her to appropriately dispose of the needles as this is a sharp.  If she has questions on how to inject or dispose of needles please schedule her for nurses visit so that we can show her proper technique.  Her mother is a Engineer, civil (consulting) and may be able to assist patient but please offer appointment to show her proper technique as well.  Please let me know if she has any questions.

## 2024-05-29 NOTE — Progress Notes (Signed)
 Called pt and number was not in services

## 2024-06-02 ENCOUNTER — Encounter (HOSPITAL_COMMUNITY): Admitting: Physical Therapy

## 2024-06-12 ENCOUNTER — Ambulatory Visit: Admitting: Urology

## 2024-06-15 NOTE — Progress Notes (Incomplete)
   History of Present Illness: Jessica Klein is a 48 y.o. year old female here for f/u of OAB sx's as well as SUI. Seen ~ 3 mos ago by Jessica Oz, NP. Referred to PT for SUI mgmt.  Past Medical History:  Diagnosis Date   Congenital deformity of spine    Depression    Enlarged thyroid  12/16/2014   Hypothyroidism    OCD (obsessive compulsive disorder)    Scoliosis     Past Surgical History:  Procedure Laterality Date   arm surgery Right    cyst removed   BACK SURGERY     rod, 3 screws for scoliosis   LEG SURGERY Right    birth mark removed   WISDOM TOOTH EXTRACTION      Home Medications:  (Not in a hospital admission)   Allergies:  Allergies  Allergen Reactions   Ceclor [Cefaclor]     Family History  Problem Relation Age of Onset   Stroke Paternal Grandfather        x 2   Skin cancer Paternal Grandfather    Cancer Paternal Grandmother        breast   Thyroid  disease Paternal Grandmother    Other Paternal Grandmother        scolosis   Heart attack Maternal Grandmother    Hypertension Maternal Grandmother    Other Maternal Grandmother        osteoporosis   Other Maternal Grandfather        aneursym   Stroke Maternal Grandfather    Cancer Father        melonoma   Hypertension Father    Thyroid  disease Mother    Hypertension Mother    Other Mother        sleep apnea; degenerative disc   Breast cancer Mother 67   Glaucoma Mother    Thyroid  disease Brother        hyperthyroidism    Social History:  reports that she has never smoked. She has never used smokeless tobacco. She reports current alcohol use. She reports that she does not use drugs.  ROS: A complete review of systems was performed.  All systems are negative except for pertinent findings as noted.  Physical Exam:  Vital signs in last 24 hours: @VSRANGES @ General:  Alert and oriented, No acute distress HEENT: Normocephalic, atraumatic Neck: No JVD or lymphadenopathy Cardiovascular: Regular  rate  Lungs: Normal inspiratory/expiratory excursion Abdomen: Soft, nontender, nondistended, no abdominal masses Back: No CVA tenderness Extremities: No edema Neurologic: Grossly intact  I have reviewed prior pt notes  I have reviewed notes from referring/previous physicians  I have reviewed urinalysis results  I have independently reviewed prior imaging  I have reviewed prior urine culture   Impression/Assessment:  ***  Plan:  Jessica Klein 06/15/2024, 7:03 AM  Jessica Klein. Jessica Mayotte MD

## 2024-06-16 ENCOUNTER — Ambulatory Visit: Admitting: Urology

## 2024-07-20 ENCOUNTER — Other Ambulatory Visit: Payer: Self-pay | Admitting: Family

## 2024-07-20 DIAGNOSIS — E039 Hypothyroidism, unspecified: Secondary | ICD-10-CM

## 2024-07-20 DIAGNOSIS — F419 Anxiety disorder, unspecified: Secondary | ICD-10-CM

## 2024-11-12 ENCOUNTER — Encounter: Admitting: Nurse Practitioner
